# Patient Record
Sex: Female | Born: 1965 | Race: Black or African American | Hispanic: No | Smoking: Former smoker
Health system: Southern US, Community
[De-identification: ages and names within clinical notes are randomized; demographics above are authoritative.]

## PROBLEM LIST (undated history)

## (undated) ENCOUNTER — Emergency Department (HOSPITAL_COMMUNITY): Admission: EM | Payer: 59 | Source: Home / Self Care

## (undated) DIAGNOSIS — D509 Iron deficiency anemia, unspecified: Secondary | ICD-10-CM

## (undated) DIAGNOSIS — L509 Urticaria, unspecified: Secondary | ICD-10-CM

## (undated) HISTORY — PX: LYMPHADENECTOMY: SHX15

## (undated) HISTORY — DX: Urticaria, unspecified: L50.9

---

## 2009-10-02 ENCOUNTER — Emergency Department (HOSPITAL_COMMUNITY): Admission: EM | Admit: 2009-10-02 | Discharge: 2009-10-02 | Payer: Self-pay | Admitting: Emergency Medicine

## 2010-10-10 ENCOUNTER — Emergency Department: Payer: Self-pay

## 2010-10-19 ENCOUNTER — Ambulatory Visit: Payer: Self-pay | Admitting: Family Medicine

## 2011-09-07 ENCOUNTER — Ambulatory Visit: Payer: Self-pay | Admitting: Medical

## 2011-11-08 ENCOUNTER — Ambulatory Visit: Payer: Self-pay | Admitting: Family Medicine

## 2011-11-15 ENCOUNTER — Ambulatory Visit: Payer: Self-pay | Admitting: Family Medicine

## 2012-08-27 ENCOUNTER — Ambulatory Visit: Payer: Self-pay | Admitting: Family Medicine

## 2013-11-26 ENCOUNTER — Emergency Department: Payer: Self-pay | Admitting: Emergency Medicine

## 2014-04-27 ENCOUNTER — Ambulatory Visit: Payer: Self-pay | Admitting: Physician Assistant

## 2014-11-15 ENCOUNTER — Emergency Department (HOSPITAL_COMMUNITY): Payer: 59

## 2014-11-15 ENCOUNTER — Emergency Department (HOSPITAL_COMMUNITY)
Admission: EM | Admit: 2014-11-15 | Discharge: 2014-11-15 | Disposition: A | Discharge status: Home / Self Care | Payer: 59 | Attending: Emergency Medicine | Admitting: Emergency Medicine

## 2014-11-15 ENCOUNTER — Encounter (HOSPITAL_COMMUNITY): Payer: Self-pay | Admitting: Emergency Medicine

## 2014-11-15 DIAGNOSIS — S3992XA Unspecified injury of lower back, initial encounter: Secondary | ICD-10-CM | POA: Diagnosis present

## 2014-11-15 DIAGNOSIS — Y9289 Other specified places as the place of occurrence of the external cause: Secondary | ICD-10-CM | POA: Insufficient documentation

## 2014-11-15 DIAGNOSIS — W108XXA Fall (on) (from) other stairs and steps, initial encounter: Secondary | ICD-10-CM | POA: Insufficient documentation

## 2014-11-15 DIAGNOSIS — W19XXXA Unspecified fall, initial encounter: Secondary | ICD-10-CM

## 2014-11-15 DIAGNOSIS — Y998 Other external cause status: Secondary | ICD-10-CM | POA: Insufficient documentation

## 2014-11-15 DIAGNOSIS — Y9389 Activity, other specified: Secondary | ICD-10-CM | POA: Insufficient documentation

## 2014-11-15 DIAGNOSIS — S300XXA Contusion of lower back and pelvis, initial encounter: Secondary | ICD-10-CM

## 2014-11-15 DIAGNOSIS — Z862 Personal history of diseases of the blood and blood-forming organs and certain disorders involving the immune mechanism: Secondary | ICD-10-CM | POA: Diagnosis not present

## 2014-11-15 HISTORY — DX: Iron deficiency anemia, unspecified: D50.9

## 2014-11-15 MED ORDER — HYDROCODONE-ACETAMINOPHEN 5-325 MG PO TABS: 1.0000 | ORAL_TABLET | Freq: Four times a day (QID) | ORAL | Status: DC | PRN

## 2014-11-15 MED ORDER — NAPROXEN 500 MG PO TABS: 500.0000 mg | ORAL_TABLET | Freq: Two times a day (BID) | ORAL | Status: DC

## 2014-11-15 NOTE — Discharge Instructions (Signed)
Do not take the narcotic if driving as it will make you sleepy. °

## 2014-11-15 NOTE — ED Provider Notes (Signed)
CSN: [id number]     Arrival date & time 11/15/14  2054 History   First MD Initiated Contact with Patient 11/15/14 2213     Chief Complaint  Patient presents with  . Fall     (Consider location/radiation/quality/duration/timing/severity/associated sxs/prior Treatment) Patient is a 49 y.o. female presenting with fall. The history is provided by the patient.  Fall This is a new problem. The current episode started yesterday.   Linda Randolph is a 49 y.o. female who presents to the ED with pain and swelling to the buttock s/p fall down 4 steps yesterday. She states that he has applied ice and taken ibuprofen without relief. She did hit her head but denies LOC and states that is better.   Past Medical History  Diagnosis Date  . Iron deficiency anemia    History reviewed. No pertinent past surgical history. No family history on file. History  Substance Use Topics  . Smoking status: Never Smoker   . Smokeless tobacco: Not on file  . Alcohol Use: No   OB History    No data available     Review of Systems Negative except as stated in HPI   Allergies  Review of patient's allergies indicates no known allergies.  Home Medications   Prior to Admission medications   Medication Sig Start Date End Date Taking? Authorizing Provider  HYDROcodone-acetaminophen (NORCO) 5-325 MG per tablet Take 1 tablet by mouth every 6 (six) hours as needed. 11/15/14   Bradey Luzier Orlene Och, NP  naproxen (NAPROSYN) 500 MG tablet Take 1 tablet (500 mg total) by mouth 2 (two) times daily. 11/15/14   Dhamar Gregory Orlene Och, NP   BP 106/68 mmHg  Pulse 73  Temp(Src) 98.1 F (36.7 C) (Oral)  Resp 18  SpO2 100%  LMP 10/25/2014 (Approximate) Physical Exam  Constitutional: She is oriented to person, place, and time. She appears well-developed and well-nourished.  HENT:  Head: Normocephalic.  Eyes: EOM are normal.  Neck: Normal range of motion. Neck supple.  Cardiovascular: Normal rate.   Pulmonary/Chest: Effort  normal.  Musculoskeletal: Normal range of motion.       Back:  Large hematoma to the left buttock. Tender with palpation  Neurological: She is alert and oriented to person, place, and time. No cranial nerve deficit.  Skin: Skin is warm and dry.  Psychiatric: She has a normal mood and affect. Her behavior is normal.  Nursing note and vitals reviewed.   ED Course  Procedures (including critical care time) Labs Review Labs Reviewed - No data to display  Imaging Review Dg Pelvis 1-2 Views  11/15/2014   CLINICAL DATA:  Patient fell down steps landing on left buttocks region  EXAM: PELVIS - 1-2 VIEW  COMPARISON:  None.  FINDINGS: Standing frontal view obtained. There is no evidence of pelvic fracture or dislocation. Joint spaces appear intact. There is an intrauterine device in mid pelvis. There are phleboliths in the pelvis.  IMPRESSION: No fracture or dislocation. Joint spaces appear intact. Intrauterine device in mid pelvis.   Electronically Signed   By: Bretta Bang III M.D.   On: 11/15/2014 21:34     MDM  49 y.o. female with swelling and ecchymosis to the left buttock after falling down 4 steps yesterday. Stable for d/c without fracture noted on x-ray. Will treat for pain and she will continue to use ice and take ibuprofen. Discussed with the patient and all questioned fully answered. She will return if any problems arise.   Final diagnoses:  Traumatic hematoma of buttock, initial encounter  Fall, initial encounter        [facility], NP 11/16/14 1610  Pricilla Loveless, MD 11/18/14 1450

## 2014-11-15 NOTE — ED Notes (Signed)
Ice pack given

## 2014-11-15 NOTE — ED Notes (Signed)
Patient here with left buttock pain.  Patient fell down 4 steps yesterday.  She did hit her head but she did not loose consciousness.  Patient has been taking ibuprofen and ice pack but she is still in pain.  Patient does have swelling to buttock noted in triage.  No open wounds.  Patient is having some trouble walking with the pain.

## 2014-11-22 ENCOUNTER — Emergency Department (HOSPITAL_COMMUNITY)
Admission: EM | Admit: 2014-11-22 | Discharge: 2014-11-22 | Disposition: A | Discharge status: Home / Self Care | Payer: 59 | Attending: Emergency Medicine | Admitting: Emergency Medicine

## 2014-11-22 ENCOUNTER — Encounter (HOSPITAL_COMMUNITY): Payer: Self-pay | Admitting: Emergency Medicine

## 2014-11-22 DIAGNOSIS — S300XXA Contusion of lower back and pelvis, initial encounter: Secondary | ICD-10-CM

## 2014-11-22 DIAGNOSIS — W109XXA Fall (on) (from) unspecified stairs and steps, initial encounter: Secondary | ICD-10-CM | POA: Insufficient documentation

## 2014-11-22 DIAGNOSIS — Y999 Unspecified external cause status: Secondary | ICD-10-CM | POA: Diagnosis not present

## 2014-11-22 DIAGNOSIS — Z793 Long term (current) use of hormonal contraceptives: Secondary | ICD-10-CM | POA: Insufficient documentation

## 2014-11-22 DIAGNOSIS — A599 Trichomoniasis, unspecified: Secondary | ICD-10-CM

## 2014-11-22 DIAGNOSIS — Y929 Unspecified place or not applicable: Secondary | ICD-10-CM | POA: Insufficient documentation

## 2014-11-22 DIAGNOSIS — Z791 Long term (current) use of non-steroidal anti-inflammatories (NSAID): Secondary | ICD-10-CM | POA: Insufficient documentation

## 2014-11-22 DIAGNOSIS — Y939 Activity, unspecified: Secondary | ICD-10-CM | POA: Insufficient documentation

## 2014-11-22 DIAGNOSIS — S79912A Unspecified injury of left hip, initial encounter: Secondary | ICD-10-CM | POA: Diagnosis present

## 2014-11-22 DIAGNOSIS — D509 Iron deficiency anemia, unspecified: Secondary | ICD-10-CM | POA: Insufficient documentation

## 2014-11-22 DIAGNOSIS — Z79899 Other long term (current) drug therapy: Secondary | ICD-10-CM | POA: Insufficient documentation

## 2014-11-22 DIAGNOSIS — A5900 Urogenital trichomoniasis, unspecified: Secondary | ICD-10-CM | POA: Insufficient documentation

## 2014-11-22 LAB — CBC
HCT: 34.5 % — ABNORMAL LOW (ref 36.0–46.0)
Hemoglobin: 10.8 g/dL — ABNORMAL LOW (ref 12.0–15.0)
MCH: 26.2 pg (ref 26.0–34.0)
MCHC: 31.3 g/dL (ref 30.0–36.0)
MCV: 83.5 fL (ref 78.0–100.0)
PLATELETS: 273 10*3/uL (ref 150–400)
RBC: 4.13 MIL/uL (ref 3.87–5.11)
RDW: 16 % — AB (ref 11.5–15.5)
WBC: 7.1 10*3/uL (ref 4.0–10.5)

## 2014-11-22 LAB — WET PREP, GENITAL
CLUE CELLS WET PREP: NONE SEEN
Yeast Wet Prep HPF POC: NONE SEEN

## 2014-11-22 MED ORDER — STERILE WATER FOR INJECTION IJ SOLN
INTRAMUSCULAR | Status: AC
  Filled 2014-11-22: qty 10

## 2014-11-22 MED ORDER — METRONIDAZOLE 500 MG PO TABS: 500.0000 mg | ORAL_TABLET | Freq: Two times a day (BID) | ORAL | Status: DC

## 2014-11-22 MED ORDER — CEFTRIAXONE SODIUM 250 MG IJ SOLR
250.0000 mg | Freq: Once | INTRAMUSCULAR | Status: AC
  Administered 2014-11-22: 250 mg via INTRAMUSCULAR
  Filled 2014-11-22: qty 250

## 2014-11-22 MED ORDER — AZITHROMYCIN 250 MG PO TABS
1000.0000 mg | ORAL_TABLET | Freq: Once | ORAL | Status: AC
  Administered 2014-11-22: 1000 mg via ORAL
  Filled 2014-11-22: qty 4

## 2014-11-22 MED ORDER — STERILE WATER FOR INJECTION IJ SOLN
0.9000 mL | Freq: Once | INTRAMUSCULAR | Status: AC
  Administered 2014-11-22: 0.9 mL via INTRAMUSCULAR

## 2014-11-22 MED ORDER — IBUPROFEN 800 MG PO TABS
800.0000 mg | ORAL_TABLET | Freq: Once | ORAL | Status: AC
  Administered 2014-11-22: 800 mg via ORAL
  Filled 2014-11-22: qty 1

## 2014-11-22 MED ORDER — IBUPROFEN 800 MG PO TABS: 800.0000 mg | ORAL_TABLET | Freq: Three times a day (TID) | ORAL | Status: DC

## 2014-11-22 NOTE — ED Notes (Signed)
Pt. reports vaginal discharge onset 2 weeks ago and left hip pain from a fall last week . Denies fever or chills , Ambulatory/respirations unlabored .

## 2014-11-22 NOTE — Discharge Instructions (Signed)
Pelvic Infection ° °If you have been diagnosed with a pelvic infection such as a sexually transmitted disease, you will need to be treated with antibiotics. Please take the medicines as prescribed. Some of these tests do not come back for 1-2 days in which case if they turn positive you will receive a phone call to let you know. If you are contacted and do have an infection consistent with a sexually transmitted disease, then you will need to tell any and all sexual partners that you have had in the last 6 months no so that they can be tested and treated as well. If you should develop severe or worsening pain in your abdomen or the pelvis or develop severe fevers,nausea or vomiting that prevent you from taking your medications, return to the emergency department immediately. Otherwise contact your local physician or county health department for a follow up appointment to complete STD testing including HIV and syphilis.  See the list of phone numbers below. ° °RESOURCE GUIDE ° °Dental Problems ° °Patients with Medicaid: °[facility]                     [facility] °[address]                                           [address] °Phone:  [phone]                                                  Phone:  [phone] ° °If unable to pay or uninsured, contact:  [facility] or [facility] to become qualified for the adult dental clinic. ° °Chronic Pain Problems °Contact [facility]  [phone] °Patients need to be referred by their primary care doctor. ° °Insufficient Money for Medicine °Contact [employer]:  call "211" or [facility] 271-5999. ° °No Primary Care Doctor °Call [facility]  832-8000 °Other agencies that provide inexpensive medical care °   [facility]  [phone] °   [facility]  [phone] °   [facility]  [phone] °   [facility]  [phone] °   [facility]  [phone] ° [facility]  [phone] ° °Psychological Services °[facility]  [phone] °[facility]  [phone] °[facility]   [phone] (emergency services [phone]) ° °Substance Abuse Resources °[facility]  336-882-2125 °[facility] 336-784-9470 °[facility] 336-285-9073 °[facility] 336-845-3988 °Residential & Outpatient Substance Abuse Program  800-659-3381 ° °Abuse/Neglect °[place] [facility] (336) 641-3795 °[place] [facility] Hot[facility] 800-378-5315 (After Hours) ° °Emergency Shelter °[facility] (336) 271-5985 ° °Maternity Homes °[facility] (336) 275-9566 °[facility] (704) 372-4663 ° °MRSA Hotline #:   832-7006 ° ° ° °[place] [facility] ° °[facility] [facility]     [employer]                          [facility] °[address] [place]                         [address]      [address]  °                                                [place]                            [place] °Phone:  349-3220                                   Phone:  [phone]                 Phone:  342-8140 ° °[facility] °Phone:  342-8316 ° °[place] [facility] Hotline °(336) 342-1394 °(336) 342-3537 (After Hours) ° ° ° °

## 2014-11-22 NOTE — ED Provider Notes (Signed)
CSN: [id number]     Arrival date & time 11/22/14  1925 History   First MD Initiated Contact with Patient 11/22/14 2115     Chief Complaint  Patient presents with  . Vaginal Discharge  . Hip Pain     (Consider location/radiation/quality/duration/timing/severity/associated sxs/prior Treatment) HPI Comments: The pt is a 49 y/o female - had a fall in last 2 weeks where she struck her L buttock on a stair - has had swelling since that time - it is worse with sitting and palpation but better with standing - no numbness / weakness.  She has hx of easy bleeding and was told that she needed a transfusion in the past but did not take it.  She has associated vaginal d/c and has intercourse with a single partner - she is unsure if she has an STD -   Patient is a 49 y.o. female presenting with vaginal discharge and hip pain. The history is provided by the patient.  Vaginal Discharge Hip Pain    Past Medical History  Diagnosis Date  . Iron deficiency anemia    History reviewed. No pertinent past surgical history. No family history on file. History  Substance Use Topics  . Smoking status: Never Smoker   . Smokeless tobacco: Not on file  . Alcohol Use: No   OB History    No data available     Review of Systems  Genitourinary: Positive for vaginal discharge.  All other systems reviewed and are negative.     Allergies  Review of patient's allergies indicates no known allergies.  Home Medications   Prior to Admission medications   Medication Sig Start Date End Date Taking? Authorizing Provider  Carbonyl Iron 15 MG/1.25ML SUSP Take 4 drops by mouth daily.   Yes Historical Provider, MD  HYDROcodone-acetaminophen (NORCO) 5-325 MG per tablet Take 1 tablet by mouth every 6 (six) hours as needed. 11/15/14  Yes Hope Orlene Och, NP  levonorgestrel (MIRENA) 20 MCG/24HR IUD 1 each by Intrauterine route once. 09/30/12  Yes Historical Provider, MD  naproxen (NAPROSYN) 500 MG tablet Take 1 tablet (500  mg total) by mouth 2 (two) times daily. 11/15/14  Yes Hope Orlene Och, NP  ibuprofen (ADVIL,MOTRIN) 800 MG tablet Take 1 tablet (800 mg total) by mouth 3 (three) times daily. 11/22/14   Eber Hong, MD  metroNIDAZOLE (FLAGYL) 500 MG tablet Take 1 tablet (500 mg total) by mouth 2 (two) times daily. 11/22/14   Eber Hong, MD   BP 131/66 mmHg  Pulse 75  Temp(Src) 98.4 F (36.9 C) (Oral)  Resp 14  Ht 5' 9.5" (1.765 m)  Wt 196 lb (88.905 kg)  BMI 28.54 kg/m2  SpO2 97%  LMP 11/08/2014 Physical Exam  Constitutional: She appears well-developed and well-nourished. No distress.  HENT:  Head: Normocephalic and atraumatic.  Mouth/Throat: Oropharynx is clear and moist. No oropharyngeal exudate.  Eyes: Conjunctivae and EOM are normal. Pupils are equal, round, and reactive to light. Right eye exhibits no discharge. Left eye exhibits no discharge. No scleral icterus.  Neck: Normal range of motion. Neck supple. No JVD present. No thyromegaly present.  Cardiovascular: Normal rate, regular rhythm, normal heart sounds and intact distal pulses.  Exam reveals no gallop and no friction rub.   No murmur heard. Pulmonary/Chest: Effort normal and breath sounds normal. No respiratory distress. She has no wheezes. She has no rales.  Abdominal: Soft. Bowel sounds are normal. She exhibits no distension and no mass. There is no tenderness.  Genitourinary:  Chaperone present for exam - normal external gen, normal internal structures, IUD strings seen, no d/c, no foul odor, no FB, no bleedign, no CMT and no adnexal ttp or masses.  Musculoskeletal: Normal range of motion. She exhibits tenderness ( ttp in the L buttock with some firmness with hematoma and bruising.). She exhibits no edema.  LE's are normal without swellign, edema or asymetry  Lymphadenopathy:    She has no cervical adenopathy.  Neurological: She is alert. Coordination normal.  Skin: Skin is warm and dry. No rash noted. No erythema.  Psychiatric: She has  a normal mood and affect. Her behavior is normal.  Nursing note and vitals reviewed.   ED Course  Procedures (including critical care time) Labs Review Labs Reviewed  WET PREP, GENITAL - Abnormal; Notable for the following:    Trich, Wet Prep MODERATE (*)    WBC, Wet Prep HPF POC FEW (*)    All other components within normal limits  CBC - Abnormal; Notable for the following:    Hemoglobin 10.8 (*)    HCT 34.5 (*)    RDW 16.0 (*)    All other components within normal limits  GC/CHLAMYDIA PROBE AMP ([place]) NOT AT [facility]    Imaging Review No results found.    MDM   Final diagnoses:  Contusion of buttock, initial encounter  Trichomonas vaginalis infection    Well appaering, r/o anemia - ice, nsaids, GC / Chalmydia / We prep.  Pt has trich - informed of tx plan, she will tell partners.   xrays neg  CBC without clinically significant anemia  Stable for d/c.  Meds given in ED:  Medications  cefTRIAXone (ROCEPHIN) injection 250 mg (not administered)  azithromycin (ZITHROMAX) tablet 1,000 mg (not administered)  ibuprofen (ADVIL,MOTRIN) tablet 800 mg (800 mg Oral Given 11/22/14 2140)    New Prescriptions   IBUPROFEN (ADVIL,MOTRIN) 800 MG TABLET    Take 1 tablet (800 mg total) by mouth 3 (three) times daily.   METRONIDAZOLE (FLAGYL) 500 MG TABLET    Take 1 tablet (500 mg total) by mouth 2 (two) times daily.      Eber Hong, MD 11/22/14 2229

## 2014-11-23 LAB — GC/CHLAMYDIA PROBE AMP (~~LOC~~) NOT AT ARMC
CHLAMYDIA, DNA PROBE: NEGATIVE
Neisseria Gonorrhea: NEGATIVE

## 2015-04-22 ENCOUNTER — Ambulatory Visit
Admission: EM | Admit: 2015-04-22 | Discharge: 2015-04-22 | Disposition: A | Discharge status: Home / Self Care | Payer: 59 | Attending: Family Medicine | Admitting: Family Medicine

## 2015-04-22 ENCOUNTER — Encounter: Payer: Self-pay | Admitting: *Deleted

## 2015-04-22 DIAGNOSIS — M26622 Arthralgia of left temporomandibular joint: Secondary | ICD-10-CM | POA: Diagnosis not present

## 2015-04-22 MED ORDER — MELOXICAM 15 MG PO TABS: 15.0000 mg | ORAL_TABLET | Freq: Every day | ORAL | Status: DC | PRN

## 2015-04-22 MED ORDER — TRAMADOL HCL 50 MG PO TABS: 50.0000 mg | ORAL_TABLET | Freq: Three times a day (TID) | ORAL | Status: DC | PRN

## 2015-04-22 NOTE — ED Provider Notes (Signed)
[facility]  ____________________________________________  Time seen: Approximately 6:42 PM  I have reviewed the triage vital signs and the nursing notes.   HISTORY  Chief Complaint Otalgia   HPI Linda Randolph is a 49 y.o. female presents with a complaint of left ear pain. Patient reports that she has recently had some runny nose and nasal congestion but states that that has almost fully resolved. Patient states over the last 2 days she has had continued left ear pain. Patient reports that she does frequently grind her teeth at night. Patient does also report that she noticed that her last 3 days that she tends to hold her attention by clenching her teeth area and also reports that she recently has been eating more and she week foods.  Denies fall or direct trauma. Denies hearing deficits. Denies dysuria or drainage. States current left ear pain is 6 out of 10 aching and throbbing. Denies pain. Reports that she knows that she has multiple dental caries but denies other pain. Reports continues to eat and drink well. Denies and fevers. Denies neck pain or pain radiation.    Past Medical History  Diagnosis Date  . Iron deficiency anemia     There are no active problems to display for this patient.   History reviewed. No pertinent past surgical history.  LMP: 2 weeks ago. Denies chance of pregnancy.    Current Outpatient Rx  Name  Route  Sig  Dispense  Refill  .           . levonorgestrel (MIRENA) 20 MCG/24HR IUD   Intrauterine   1 each by Intrauterine route once.         .           .           .           .           .           .             Allergies Review of patient's allergies indicates no known allergies.  History reviewed. No pertinent family history.  Social History Social History  Substance Use Topics  . Smoking status: Never Smoker   . Smokeless tobacco: Never Used  . Alcohol Use: No    Review of Systems Constitutional: No  fever/chills Eyes: No visual changes. ENT: No sore throat. Left ear pain. Cardiovascular: Denies chest pain. Respiratory: Denies shortness of breath. Gastrointestinal: No abdominal pain.  No nausea, no vomiting.  No diarrhea.  No constipation. Genitourinary: Negative for dysuria. Musculoskeletal: Negative for back pain. Skin: Negative for rash. Neurological: Negative for headaches, focal weakness or numbness.  10-point ROS otherwise negative.  ____________________________________________   PHYSICAL EXAM:  VITAL SIGNS: ED Triage Vitals  Enc Vitals Group     BP 04/22/15 1713 139/75 mmHg     Pulse Rate 04/22/15 1713 76     Resp 04/22/15 1713 18     Temp 04/22/15 1713 98.4 F (36.9 C)     Temp Source 04/22/15 1713 Oral     SpO2 04/22/15 1713 99 %     Weight 04/22/15 1713 188 lb (85.276 kg)     Height 04/22/15 1713 5\' 10"  (1.778 m)     Head Cir --      Peak Flow --      Pain Score 04/22/15 1721 10     Pain Loc --  Pain Edu? --      Excl. in GC? --     Constitutional: Alert and oriented. Well appearing and in no acute distress. Eyes: Conjunctivae are normal. PERRL. EOMI. Head: Atraumatic. No sinus tenderness to palpation. No swelling. No erythema. Right TMJ nontender. Left TMJ moderate tenderness to palpation, full range of motion. No trismus.   Ears: no erythema, normal TMs bilaterally. No exudate or drainage bilaterally.  Nose: No congestion/rhinnorhea.  Mouth/Throat: Mucous membranes are moist.  Oropharynx non-erythematous. No tonsillar swelling or exudate. Multiple dental caries, no gum line erythema or swelling. No palpable or visualized dental abscess. Neck: No stridor.  No cervical spine tenderness to palpation. Hematological/Lymphatic/Immunilogical: No cervical lymphadenopathy. Cardiovascular: Normal rate, regular rhythm. Grossly normal heart sounds.  Good peripheral circulation. Respiratory: Normal respiratory effort.  No retractions. Lungs CTAB. No wheezes,  rales or rhonchi. Gastrointestinal: Soft and nontender.  Musculoskeletal: No lower or upper extremity tenderness nor edema.  Neurologic:  Normal speech and language. No gross focal neurologic deficits are appreciated. No gait instability. Skin:  Skin is warm, dry and intact. No rash noted. Psychiatric: Mood and affect are normal. Speech and behavior are normal.  ____________________________________________   LABS (all labs ordered are listed, but only abnormal results are displayed)  Labs Reviewed - No data to display  INITIAL IMPRESSION / ASSESSMENT AND PLAN / ED COURSE  Pertinent labs & imaging results that were available during my care of the patient were reviewed by me and considered in my medical decision making (see chart for details).  Very well-appearing patient. No acute distress. Presents for the complaints of 2 days of left otalgia. Patient also reports recent runny nose and nasal congestion which has resolved. Denies fall or trauma. Reports that she does grind her teeth and hold her tension by clinching her teeth. Patient with left point TMJ tenderness. Full ROM, Reports continues to eat and drink well. No signs of acute bacterial infection. Will treat with daily Mobic and when necessary tramadol for pain. Counseled also regarding following up with her dentist for fitting of a dental guard. Patient states that she has a dentist appointment in the next 2 weeks. Apply ice, avoid chewy foods as well as supportive treatments.  Discussed follow up with Primary care physician this week. Discussed follow up and return parameters including no resolution or any worsening concerns. Patient verbalized understanding and agreed to plan.   ____________________________________________   FINAL CLINICAL IMPRESSION(S) / ED DIAGNOSES  Final diagnoses:  pain of left temporomandibular joint       Renford Dills, NP 04/22/15 1850

## 2015-04-22 NOTE — Discharge Instructions (Signed)
Take medication as prescribed. Rest. Apply ice.Avoid excessively chewy foods.   Follow up with your dentist as discussed. Follow-up with primary care physician as needed. Return to urgent care as needed for new or worsening concerns.

## 2015-04-22 NOTE — ED Notes (Signed)
Patient reports that left ear ache symptoms started this past Monday and have worsened. Patient also reports left side sinus pain.

## 2015-05-05 ENCOUNTER — Encounter (HOSPITAL_COMMUNITY): Payer: Self-pay | Admitting: Emergency Medicine

## 2015-05-05 DIAGNOSIS — R51 Headache: Secondary | ICD-10-CM | POA: Insufficient documentation

## 2015-05-05 DIAGNOSIS — D509 Iron deficiency anemia, unspecified: Secondary | ICD-10-CM | POA: Insufficient documentation

## 2015-05-05 DIAGNOSIS — Z792 Long term (current) use of antibiotics: Secondary | ICD-10-CM | POA: Insufficient documentation

## 2015-05-05 DIAGNOSIS — Z79899 Other long term (current) drug therapy: Secondary | ICD-10-CM | POA: Diagnosis not present

## 2015-05-05 DIAGNOSIS — Z791 Long term (current) use of non-steroidal anti-inflammatories (NSAID): Secondary | ICD-10-CM | POA: Insufficient documentation

## 2015-05-05 NOTE — ED Notes (Signed)
Pt states for the last month she has been having a left sided headache into the left side of her face into left ear and under chin. Pt states her PCP told her it could be from stress or clinching her jaw however pain is no better pt has been taking motrin and tyenlol with no relief.  Pt has no neuro deficits. No dizziness or blurry vision.

## 2015-05-06 ENCOUNTER — Emergency Department (HOSPITAL_COMMUNITY)
Admission: EM | Admit: 2015-05-06 | Discharge: 2015-05-06 | Disposition: A | Discharge status: Home / Self Care | Payer: 59 | Attending: Emergency Medicine | Admitting: Emergency Medicine

## 2015-05-06 ENCOUNTER — Emergency Department (HOSPITAL_COMMUNITY): Payer: 59

## 2015-05-06 ENCOUNTER — Telehealth: Payer: Self-pay | Admitting: Emergency Medicine

## 2015-05-06 DIAGNOSIS — R51 Headache: Secondary | ICD-10-CM

## 2015-05-06 DIAGNOSIS — R519 Headache, unspecified: Secondary | ICD-10-CM

## 2015-05-06 MED ORDER — SODIUM CHLORIDE 0.9 % IV BOLUS (SEPSIS)
1000.0000 mL | Freq: Once | INTRAVENOUS | Status: AC
  Administered 2015-05-06: 1000 mL via INTRAVENOUS

## 2015-05-06 MED ORDER — KETOROLAC TROMETHAMINE 30 MG/ML IJ SOLN
30.0000 mg | Freq: Once | INTRAMUSCULAR | Status: AC
  Administered 2015-05-06: 30 mg via INTRAVENOUS
  Filled 2015-05-06: qty 1

## 2015-05-06 MED ORDER — METOCLOPRAMIDE HCL 5 MG/ML IJ SOLN
10.0000 mg | Freq: Once | INTRAMUSCULAR | Status: AC
  Administered 2015-05-06: 10 mg via INTRAVENOUS
  Filled 2015-05-06: qty 2

## 2015-05-06 MED ORDER — BUTALBITAL-APAP-CAFFEINE 50-325-40 MG PO TABS: 1.0000 | ORAL_TABLET | Freq: Four times a day (QID) | ORAL | Status: AC | PRN

## 2015-05-06 MED ORDER — DIPHENHYDRAMINE HCL 50 MG/ML IJ SOLN
25.0000 mg | Freq: Once | INTRAMUSCULAR | Status: AC
  Administered 2015-05-06: 25 mg via INTRAVENOUS
  Filled 2015-05-06: qty 1

## 2015-05-06 NOTE — ED Notes (Signed)
No answer for room placement.

## 2015-05-06 NOTE — ED Notes (Signed)
Transported patient to CT.

## 2015-05-06 NOTE — Discharge Instructions (Signed)
Recurrent Migraine Headache A migraine headache is an intense, throbbing pain on one or both sides of your head. Recurrent migraines keep coming back. A migraine can last for 30 minutes to several hours. CAUSES  The exact cause of a migraine headache is not always known. However, a migraine may be caused when nerves in the brain become irritated and release chemicals that cause inflammation. This causes pain. Certain things may also trigger migraines, such as:   Alcohol.  Smoking.  Stress.  Menstruation.  Aged cheeses.  Foods or drinks that contain nitrates, glutamate, aspartame, or tyramine.  Lack of sleep.  Chocolate.  Caffeine.  Hunger.  Physical exertion.  Fatigue.  Medicines used to treat chest pain (nitroglycerine), birth control pills, estrogen, and some blood pressure medicines. SYMPTOMS   Pain on one or both sides of your head.  Pulsating or throbbing pain.  Severe pain that prevents daily activities.  Pain that is aggravated by any physical activity.  Nausea, vomiting, or both.  Dizziness.  Pain with exposure to bright lights, loud noises, or activity.  General sensitivity to bright lights, loud noises, or smells. Before you get a migraine, you may get warning signs that a migraine is coming (aura). An aura may include:  Seeing flashing lights.  Seeing bright spots, halos, or zigzag lines.  Having tunnel vision or blurred vision.  Having feelings of numbness or tingling.  Having trouble talking.  Having muscle weakness. DIAGNOSIS  A recurrent migraine headache is often diagnosed based on:  Symptoms.  Physical examination.  A CT scan or MRI of your head. These imaging tests cannot diagnose migraines but can help rule out other causes of headaches.  TREATMENT  Medicines may be given for pain and nausea. Medicines can also be given to help prevent recurrent migraines. HOME CARE INSTRUCTIONS  Only take over-the-counter or prescription  medicines for pain or discomfort as directed by your health care provider. The use of long-term narcotics is not recommended.  Lie down in a dark, quiet room when you have a migraine.  Keep a journal to find out what may trigger your migraine headaches. For example, write down:  What you eat and drink.  How much sleep you get.  Any change to your diet or medicines.  Limit alcohol consumption.  Quit smoking if you smoke.  Get 7-9 hours of sleep, or as recommended by your health care provider.  Limit stress.  Keep lights dim if bright lights bother you and make your migraines worse. SEEK MEDICAL CARE IF:   You do not get relief from the medicines given to you.  You have a recurrence of pain.  You have a fever. SEEK IMMEDIATE MEDICAL CARE IF:  Your migraine becomes severe.  You have a stiff neck.  You have loss of vision.  You have muscular weakness or loss of muscle control.  You start losing your balance or have trouble walking.  You feel faint or pass out.  You have severe symptoms that are different from your first symptoms. MAKE SURE YOU:   Understand these instructions.  Will watch your condition.  Will get help right away if you are not doing well or get worse.   This information is not intended to replace advice given to you by your health care provider. Make sure you discuss any questions you have with your health care provider.   Document Released: 01/03/2001 Document Revised: 05/01/2014 Document Reviewed: 12/16/2012 Elsevier Interactive Patient Education 2016 [employer]  

## 2015-05-06 NOTE — ED Notes (Signed)
Patient up ambulatory to the bathroom at this time without any difficulty or distress 

## 2015-05-06 NOTE — ED Provider Notes (Signed)
CSN: [id number]     Arrival date & time 05/05/15  2058 History   First MD Initiated Contact with Patient 05/06/15 0020     Chief Complaint  Patient presents with  . Headache  . Facial Pain     (Consider location/radiation/quality/duration/timing/severity/associated sxs/prior Treatment) HPI   50 year old female without any significant past medical history aside from iron deficiency anemia who presents for evaluation of headache. Patient reports progressive left-sided headache ongoing for the past 1 month. She described it as throbbing sensation, persistent, seems to be worsening with stress. She has been taking over-the-counter pain medication including Tylenol and ibuprofen with some relief but the headache never fully resolved. She was seen at urgent care for the same complaint and was prescribed meloxicam and tramadol in the past which did help. Urgent Care felt that the headache was attributed to stress. She currently rates her headache as 10 out of 10. Showering in warm water seems to help with her headache. There is no associated fever, URI symptoms, nasal congestion, hearing changes, vision changes, neck stiffness, speech problem, focal numbness or weakness, or rash. She denies any specific injury. She does not have any active cancer. She denies any fever, night sweats, or abnormal weight changes. No prior history of migraine headache. Denies any light or sound sensitivity or any scintillating scotoma. Patient is a nonsmoker. No associated lightheadedness of dizziness.  Past Medical History  Diagnosis Date  . Iron deficiency anemia    History reviewed. No pertinent past surgical history. No family history on file. Social History  Substance Use Topics  . Smoking status: Never Smoker   . Smokeless tobacco: Never Used  . Alcohol Use: No   OB History    No data available     Review of Systems  All other systems reviewed and are negative.     Allergies  Review of patient's  allergies indicates no known allergies.  Home Medications   Prior to Admission medications   Medication Sig Start Date End Date Taking? Authorizing Provider  Carbonyl Iron 15 MG/1.25ML SUSP Take 4 drops by mouth daily.    Historical Provider, MD  HYDROcodone-acetaminophen (NORCO) 5-325 MG per tablet Take 1 tablet by mouth every 6 (six) hours as needed. 11/15/14   Hope Orlene Och, NP  ibuprofen (ADVIL,MOTRIN) 800 MG tablet Take 1 tablet (800 mg total) by mouth 3 (three) times daily. 11/22/14   Eber Hong, MD  levonorgestrel (MIRENA) 20 MCG/24HR IUD 1 each by Intrauterine route once. 09/30/12   Historical Provider, MD  meloxicam (MOBIC) 15 MG tablet Take 1 tablet (15 mg total) by mouth daily as needed for pain. 04/22/15   Renford Dills, NP  metroNIDAZOLE (FLAGYL) 500 MG tablet Take 1 tablet (500 mg total) by mouth 2 (two) times daily. 11/22/14   Eber Hong, MD  naproxen (NAPROSYN) 500 MG tablet Take 1 tablet (500 mg total) by mouth 2 (two) times daily. 11/15/14   Hope Orlene Och, NP  traMADol (ULTRAM) 50 MG tablet Take 1 tablet (50 mg total) by mouth every 8 (eight) hours as needed (Do not drive or operate machinery while taking as can cause drowsiness.). 04/22/15   Renford Dills, NP   BP 127/62 mmHg  Pulse 65  Temp(Src) 98.1 F (36.7 C) (Oral)  Resp 16  Ht 5\' 10"  (1.778 m)  Wt 188 lb (85.276 kg)  BMI 26.98 kg/m2  SpO2 100%  LMP 04/08/2015 Physical Exam  Constitutional: She is oriented to person, place, and time. She appears well-developed  and well-nourished. No distress.  Well appearing African-American female in no acute discomfort, nontoxic  HENT:  Head: Atraumatic.  Right Ear: External ear normal.  Left Ear: External ear normal.  Mouth/Throat: Oropharynx is clear and moist. No oropharyngeal exudate.  Eyes: Conjunctivae and EOM are normal. Pupils are equal, round, and reactive to light.  Neck: Normal range of motion. Neck supple.  No nuchal rigidity  Cardiovascular: Normal rate and  regular rhythm.   Pulmonary/Chest: Effort normal and breath sounds normal.  Abdominal: Soft.  Neurological: She is alert and oriented to person, place, and time.  Neurologic exam:  Speech clear, pupils equal round reactive to light, extraocular movements intact  Normal peripheral visual fields Cranial nerves III through XII normal including no facial droop Follows commands, moves all extremities x4, normal strength to bilateral upper and lower extremities at all major muscle groups including grip Sensation normal to light touch and pinprick Coordination intact, no limb ataxia, finger-nose-finger normal Rapid alternating movements normal No pronator drift Gait normal   Skin: No rash noted.  Psychiatric: She has a normal mood and affect.  Nursing note and vitals reviewed.   ED Course  Procedures (including critical care time)   MDM   Final diagnoses:  Recurrent headache    BP 115/61 mmHg  Pulse 66  Temp(Src) 98.1 F (36.7 C) (Oral)  Resp 18  Ht 5\' 10"  (1.778 m)  Wt 85.276 kg  BMI 26.98 kg/m2  SpO2 99%  LMP 04/08/2015   6:43 AM Patient presents with a persistent left-sided headache ongoing for the past month. She does not exhibit any red flags. She is well appearing. No fever or nuchal rigidity concerning for meningitis. No acute onset thunderclap headache concerning for subarachnoid hemorrhage. No focal neuro deficits concerning for stroke. Given the prolonged duration of headache, I will obtain a head CT scan further evaluation. Migraine cocktail offer for symptomatic relief. Patient voiced understanding and agrees with plan.  10:49 AM Head CT unremarkable. Headache markedly improved.  Will d/c with neuro f/u and fioricet for pain as needed.    Fayrene HelperBowie Cohl Behrens, PA-C 05/06/15 1049  Shon Batonourtney F Horton, MD 05/06/15 2256

## 2015-05-08 ENCOUNTER — Ambulatory Visit
Admission: EM | Admit: 2015-05-08 | Discharge: 2015-05-08 | Disposition: A | Discharge status: Home / Self Care | Payer: 59 | Attending: Family Medicine | Admitting: Family Medicine

## 2015-05-08 ENCOUNTER — Encounter: Payer: Self-pay | Admitting: Gynecology

## 2015-05-08 DIAGNOSIS — M26622 Arthralgia of left temporomandibular joint: Secondary | ICD-10-CM | POA: Diagnosis not present

## 2015-05-08 MED ORDER — TRAMADOL HCL 50 MG PO TABS: ORAL_TABLET | ORAL | Status: DC

## 2015-05-08 MED ORDER — MELOXICAM 15 MG PO TABS: 15.0000 mg | ORAL_TABLET | Freq: Every day | ORAL | Status: DC

## 2015-05-08 NOTE — ED Provider Notes (Signed)
CSN: [id number]     Arrival date & time 05/08/15  1001 History   First MD Initiated Contact with Patient 05/08/15 1209     Chief Complaint  Patient presents with  . 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 Denies any fevers, chills.   Patient is a 50 y.o. female presenting with headaches. The history is provided by the patient.  Headache   Past Medical History  Diagnosis Date  . Iron deficiency anemia    Past Surgical History  Procedure Laterality Date  . Lymphadenectomy     No family history on file. Social History  Substance Use Topics  . Smoking status: Never Smoker   . Smokeless tobacco: Never Used  . Alcohol Use: No   OB History    No data available     Review of Systems  Neurological: Positive for headaches.    Allergies  Review of patient's allergies indicates no known allergies.  Home Medications   Prior to Admission medications   Medication Sig Start Date End Date Taking? Authorizing Provider  butalbital-acetaminophen-caffeine (FIORICET) [phone] MG tablet Take 1-2 tablets by mouth every 6 (six) hours as needed for headache. 05/06/15 05/05/16 Yes Fayrene HelperBowie Tran, PA-C  levonorgestrel (MIRENA) 20 MCG/24HR IUD 1 each by Intrauterine route once. 09/30/12   Historical Provider, MD  meloxicam (MOBIC) 15 MG tablet Take 1 tablet (15 mg total) by mouth daily. 05/08/15   Payton Mccallumrlando Lenette Rau, MD  traMADol (ULTRAM) 50 MG tablet 1 tab po q 8 hours prn 05/08/15   Payton Mccallumrlando Tc Kapusta, MD   Meds Ordered and Administered this Visit  Medications - No data to display  BP 129/68 mmHg  Pulse 64  Temp(Src) 98 F (36.7 C) (Oral)  Resp 16  Ht 5\' 10"  (1.778 m)  Wt 191 lb (86.637  kg)  BMI 27.41 kg/m2  SpO2 100%  LMP 04/08/2015 No data found.   Physical Exam  Constitutional: She appears well-developed and well-nourished. No distress.  HENT:  Head: Normocephalic and atraumatic.  Right Ear: Tympanic membrane, external ear and ear canal normal.  Left Ear: Tympanic membrane, external ear and ear canal normal.  Nose: No mucosal edema, rhinorrhea, nose lacerations, sinus tenderness, nasal deformity, septal deviation or nasal septal hematoma. No epistaxis.  No foreign bodies.  Mouth/Throat: Uvula is midline, oropharynx is clear and moist and mucous membranes are normal. No oropharyngeal exudate, posterior oropharyngeal edema, posterior oropharyngeal erythema or tonsillar abscesses.  Tenderness to palpation over the left TMJ  Eyes: Conjunctivae and EOM are normal. Pupils are equal, round, and reactive to light. Right eye exhibits no discharge. Left eye exhibits no discharge. No scleral icterus.  Neck: Normal range of motion. Neck supple. No thyromegaly present.  Cardiovascular: Normal rate, regular rhythm and normal heart sounds.   Pulmonary/Chest: Effort normal and breath sounds normal. No respiratory distress. She has no wheezes. She has no rales.  Lymphadenopathy:    She has no cervical adenopathy.  Skin: No rash noted. She is not diaphoretic.  Nursing note and vitals reviewed.   ED Course  Procedures (including critical care time)  Labs Review Labs Reviewed - No data to display  Imaging Review No results found.   Visual Acuity Review  Right Eye Distance:   Left Eye Distance:  Bilateral Distance:    Right Eye Near:   Left Eye Near:    Bilateral Near:         MDM   1. Arthralgia of left temporomandibular joint    Discharge Medication List as of 05/08/2015 12:29 PM    START taking these medications   Details  traMADol (ULTRAM) 50 MG tablet 1 tab po q 8 hours prn, Print       1. diagnosis reviewed with patient 2. rx as per orders above;  reviewed possible side effects, interactions, risks and benefits  3. Recommend supportive treatment with ice to affected area 4. Follow-up with PCP and dentist     Payton Mccallum, MD 05/08/15 1536

## 2015-05-08 NOTE — ED Notes (Addendum)
Patient c/o  Severed left side head pain /ear/ jaw and eyes x 1 month. Per pt. [facility]05/06/15) days and was given Rx Fioricet ,but the medication not working.Patient stated also seen at an urgent care the same day(05/06/15) and was given RX Meloxicam 15mg  #30 which is not working either. Patient stated was seen on 04/22/15 at our clinic for the same symptoms and according to our records was given Rx Meloxicam15 mg and Tramadol 50mg . Patient stated lost medication that was given to her in December by our clinic.

## 2015-06-11 ENCOUNTER — Ambulatory Visit
Admission: EM | Admit: 2015-06-11 | Discharge: 2015-06-11 | Disposition: A | Discharge status: Home / Self Care | Payer: 59 | Attending: Family Medicine | Admitting: Family Medicine

## 2015-06-11 ENCOUNTER — Encounter: Payer: Self-pay | Admitting: *Deleted

## 2015-06-11 DIAGNOSIS — J0100 Acute maxillary sinusitis, unspecified: Secondary | ICD-10-CM | POA: Diagnosis not present

## 2015-06-11 DIAGNOSIS — J069 Acute upper respiratory infection, unspecified: Secondary | ICD-10-CM | POA: Diagnosis not present

## 2015-06-11 LAB — RAPID INFLUENZA A&B ANTIGENS (ARMC ONLY)
INFLUENZA A (ARMC): NOT DETECTED
INFLUENZA B (ARMC): NOT DETECTED

## 2015-06-11 LAB — RAPID STREP SCREEN (MED CTR MEBANE ONLY): Streptococcus, Group A Screen (Direct): NEGATIVE

## 2015-06-11 MED ORDER — HYDROCOD POLST-CPM POLST ER 10-8 MG/5ML PO SUER: 5.0000 mL | Freq: Two times a day (BID) | ORAL | Status: DC

## 2015-06-11 MED ORDER — AZITHROMYCIN 250 MG PO TABS: ORAL_TABLET | ORAL | Status: DC

## 2015-06-11 MED ORDER — BENZONATATE 200 MG PO CAPS: ORAL_CAPSULE | ORAL | Status: DC

## 2015-06-11 MED ORDER — FLUTICASONE PROPIONATE 50 MCG/ACT NA SUSP: 2.0000 | Freq: Every day | NASAL | Status: DC

## 2015-06-11 NOTE — Discharge Instructions (Signed)
Cool Mist Vaporizers Vaporizers may help relieve the symptoms of a cough and cold. They add moisture to the air, which helps mucus to become thinner and less sticky. This makes it easier to breathe and cough up secretions. Cool mist vaporizers do not cause serious burns like hot mist vaporizers, which may also be called steamers or humidifiers. Vaporizers have not been proven to help with colds. You should not use a vaporizer if you are allergic to mold. HOME CARE INSTRUCTIONS  Follow the package instructions for the vaporizer.  Do not use anything other than distilled water in the vaporizer.  Do not run the vaporizer all of the time. This can cause mold or bacteria to grow in the vaporizer.  Clean the vaporizer after each time it is used.  Clean and dry the vaporizer well before storing it.  Stop using the vaporizer if worsening respiratory symptoms develop.   This information is not intended to replace advice given to you by your health care provider. Make sure you discuss any questions you have with your health care provider.   Document Released: 01/06/2004 Document Revised: 04/15/2013 Document Reviewed: 08/28/2012 Elsevier Interactive Patient Education 2016 Else[employer] Inc[employer]  Sinusitis, Adult Sinusitis is redness, soreness, and inflammation of the paranasal sinuses. Paranasal sinuses are air pockets within the bones of your face. They are located beneath your eyes, in the middle of your forehead, and above your eyes. In healthy paranasal sinuses, mucus is able to drain out, and air is able to circulate through them by way of your nose. However, when your paranasal sinuses are inflamed, mucus and air can become trapped. This can allow bacteria and other germs to grow and cause infection. Sinusitis can develop quickly and last only a short time (acute) or continue over a long period (chronic). Sinusitis that lasts for more than 12 weeks is considered chronic. CAUSES Causes of sinusitis  include:  Allergies.  Structural abnormalities, such as displacement of the cartilage that separates your nostrils (deviated septum), which can decrease the air flow through your nose and sinuses and affect sinus drainage.  Functional abnormalities, such as when the small hairs (cilia) that line your sinuses and help remove mucus do not work properly or are not present. SIGNS AND SYMPTOMS Symptoms of acute and chronic sinusitis are the same. The primary symptoms are pain and pressure around the affected sinuses. Other symptoms include:  Upper toothache.  Earache.  Headache.  Bad breath.  Decreased sense of smell and taste.  A cough, which worsens when you are lying flat.  Fatigue.  Fever.  Thick drainage from your nose, which often is green and may contain pus (purulent).  Swelling and warmth over the affected sinuses. DIAGNOSIS Your health care provider will perform a physical exam. During your exam, your health care provider may perform any of the following to help determine if you have acute sinusitis or chronic sinusitis:  Look in your nose for signs of abnormal growths in your nostrils (nasal polyps).  Tap over the affected sinus to check for signs of infection.  View the inside of your sinuses using an imaging device that has a light attached (endoscope). If your health care provider suspects that you have chronic sinusitis, one or more of the following tests may be recommended:  Allergy tests.  Nasal culture. A sample of mucus is taken from your nose, sent to a lab, and screened for bacteria.  Nasal cytology. A sample of mucus is taken from your nose and examined  by your health care provider to determine if your sinusitis is related to an allergy. TREATMENT Most cases of acute sinusitis are related to a viral infection and will resolve on their own within 10 days. Sometimes, medicines are prescribed to help relieve symptoms of both acute and chronic sinusitis.  These may include pain medicines, decongestants, nasal steroid sprays, or saline sprays. However, for sinusitis related to a bacterial infection, your health care provider will prescribe antibiotic medicines. These are medicines that will help kill the bacteria causing the infection. Rarely, sinusitis is caused by a fungal infection. In these cases, your health care provider will prescribe antifungal medicine. For some cases of chronic sinusitis, surgery is needed. Generally, these are cases in which sinusitis recurs more than 3 times per year, despite other treatments. HOME CARE INSTRUCTIONS  Drink plenty of water. Water helps thin the mucus so your sinuses can drain more easily.  Use a humidifier.  Inhale steam 3-4 times a day (for example, sit in the bathroom with the shower running).  Apply a warm, moist washcloth to your face 3-4 times a day, or as directed by your health care provider.  Use saline nasal sprays to help moisten and clean your sinuses.  Take medicines only as directed by your health care provider.  If you were prescribed either an antibiotic or antifungal medicine, finish it all even if you start to feel better. SEEK IMMEDIATE MEDICAL CARE IF:  You have increasing pain or severe headaches.  You have nausea, vomiting, or drowsiness.  You have swelling around your face.  You have vision problems.  You have a stiff neck.  You have difficulty breathing.   This information is not intended to replace advice given to you by your health care provider. Make sure you discuss any questions you have with your health care provider.   Document Released: 04/10/2005 Document Revised: 05/01/2014 Document Reviewed: 04/25/2011 Elsevier Interactive Patient Education 2016 Else[employer] Inc.  Upper Respiratory Infection, Adult Most upper respiratory infections (URIs) are a viral infection of the air passages leading to the lungs. A URI affects the nose, throat, and upper air  passages. The most common type of URI is nasopharyngitis and is typically referred to as "the common cold." URIs run their course and usually go away on their own. Most of the time, a URI does not require medical attention, but sometimes a bacterial infection in the upper airways can follow a viral infection. This is called a secondary infection. Sinus and middle ear infections are common types of secondary upper respiratory infections. Bacterial pneumonia can also complicate a URI. A URI can worsen asthma and chronic obstructive pulmonary disease (COPD). Sometimes, these complications can require emergency medical care and may be life threatening.  CAUSES Almost all URIs are caused by viruses. A virus is a type of germ and can spread from one person to another.  RISKS FACTORS You may be at risk for a URI if:   You smoke.   You have chronic heart or lung disease.  You have a weakened defense (immune) system.   You are very young or very old.   You have nasal allergies or asthma.  You work in crowded or poorly ventilated areas.  You work in health care facilities or schools. SIGNS AND SYMPTOMS  Symptoms typically develop 2-3 days after you come in contact with a cold virus. Most viral URIs last 7-10 days. However, viral URIs from the influenza virus (flu virus) can last 14-18 days and  are typically more severe. Symptoms may include:   Runny or stuffy (congested) nose.   Sneezing.   Cough.   Sore throat.   Headache.   Fatigue.   Fever.   Loss of appetite.   Pain in your forehead, behind your eyes, and over your cheekbones (sinus pain).  Muscle aches.  DIAGNOSIS  Your health care provider may diagnose a URI by:  Physical exam.  Tests to check that your symptoms are not due to another condition such as:  Strep throat.  Sinusitis.  Pneumonia.  Asthma. TREATMENT  A URI goes away on its own with time. It cannot be cured with medicines, but medicines may  be prescribed or recommended to relieve symptoms. Medicines may help:  Reduce your fever.  Reduce your cough.  Relieve nasal congestion. HOME CARE INSTRUCTIONS   Take medicines only as directed by your health care provider.   Gargle warm saltwater or take cough drops to comfort your throat as directed by your health care provider.  Use a warm mist humidifier or inhale steam from a shower to increase air moisture. This may make it easier to breathe.  Drink enough fluid to keep your urine clear or pale yellow.   Eat soups and other clear broths and maintain good nutrition.   Rest as needed.   Return to work when your temperature has returned to normal or as your health care provider advises. You may need to stay home longer to avoid infecting others. You can also use a face mask and careful hand washing to prevent spread of the virus.  Increase the usage of your inhaler if you have asthma.   Do not use any tobacco products, including cigarettes, chewing tobacco, or electronic cigarettes. If you need help quitting, ask your health care provider. PREVENTION  The best way to protect yourself from getting a cold is to practice good hygiene.   Avoid oral or hand contact with people with cold symptoms.   Wash your hands often if contact occurs.  There is no clear evidence that vitamin C, vitamin E, echinacea, or exercise reduces the chance of developing a cold. However, it is always recommended to get plenty of rest, exercise, and practice good nutrition.  SEEK MEDICAL CARE IF:   You are getting worse rather than better.   Your symptoms are not controlled by medicine.   You have chills.  You have worsening shortness of breath.  You have brown or red mucus.  You have yellow or brown nasal discharge.  You have pain in your face, especially when you bend forward.  You have a fever.  You have swollen neck glands.  You have pain while swallowing.  You have white  areas in the back of your throat. SEEK IMMEDIATE MEDICAL CARE IF:   You have severe or persistent:  Headache.  Ear pain.  Sinus pain.  Chest pain.  You have chronic lung disease and any of the following:  Wheezing.  Prolonged cough.  Coughing up blood.  A change in your usual mucus.  You have a stiff neck.  You have changes in your:  Vision.  Hearing.  Thinking.  Mood. MAKE SURE YOU:   Understand these instructions.  Will watch your condition.  Will get help right away if you are not doing well or get worse.   This information is not intended to replace advice given to you by your health care provider. Make sure you discuss any questions you have with your health  care provider.   Document Released: 10/04/2000 Document Revised: 08/25/2014 Document Reviewed: 07/16/2013 Elsevier Interactive Patient Education [employer]

## 2015-06-11 NOTE — ED Notes (Signed)
Productive cough- clear, sore throat, left ear pain, ? Fever x3 weeks.

## 2015-06-11 NOTE — ED Provider Notes (Signed)
CSN: [id number]     Arrival date & time 06/11/15  1517 History   First MD Initiated Contact with Patient 06/11/15 1722     Chief Complaint  Patient presents with  . Cough  . Otalgia  . 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 Left ear is also bothersome. He has had chills but has not taken her temperature at all. She has tried Zyrtec and Benadryl without any help. Sputum production is  clear. Today she is afebrile has a pulse of 78 respirations 16 blood pressure 135/71 and O2 sat is 99% on room air.    Past Medical History  Diagnosis Date  . Iron deficiency anemia    Past Surgical History  Procedure Laterality Date  . Lymphadenectomy     History reviewed. No pertinent family history. Social History  Substance Use Topics  . Smoking status: Never Smoker   . Smokeless tobacco: Never Used  . Alcohol Use: No   OB History    No data available     Review of Systems  Constitutional: Positive for chills. Negative for fever, activity change, appetite change and fatigue.  HENT: Positive for congestion, ear pain, postnasal drip, rhinorrhea, sinus pressure and sore throat.   Respiratory: Positive for cough and wheezing. Negative for shortness of breath and stridor.   All other systems reviewed and are negative.   Allergies  Review of patient's allergies indicates no known allergies.  Home Medications   Prior to Admission medications   Medication Sig Start Date End Date Taking? Authorizing Provider  butalbital-acetaminophen-caffeine (FIORICET) [phone] MG tablet Take 1-2 tablets by mouth every 6 (six) hours as needed for headache. 05/06/15 05/05/16 Yes Fayrene Helper, PA-C  levonorgestrel (MIRENA) 20 MCG/24HR IUD 1 each by Intrauterine route once. 09/30/12  Yes Historical Provider, MD   traMADol (ULTRAM) 50 MG tablet 1 tab po q 8 hours prn 05/08/15  Yes Payton Mccallum, MD  azithromycin (ZITHROMAX Z-PAK) 250 MG tablet Use as per package instructions 06/11/15   Lutricia Feil, PA-C  benzonatate (TESSALON) 200 MG capsule Take one capsule TID for cough 06/11/15   Lutricia Feil, PA-C  chlorpheniramine-HYDROcodone (TUSSIONEX PENNKINETIC ER) 10-8 MG/5ML SUER Take 5 mLs by mouth 2 (two) times daily. 06/11/15   Lutricia Feil, PA-C  fluticasone (FLONASE) 50 MCG/ACT nasal spray Place 2 sprays into both nostrils daily. 06/11/15   Lutricia Feil, PA-C  meloxicam (MOBIC) 15 MG tablet Take 1 tablet (15 mg total) by mouth daily. 05/08/15   Payton Mccallum, MD   Meds Ordered and Administered this Visit  Medications - No data to display  BP 135/71 mmHg  Pulse 78  Temp(Src) 97.6 F (36.4 C) (Oral)  Resp 16  Ht  (1.778 m)  Wt 185 lb (83.915 kg)  BMI 26.54 kg/m2  SpO2 99% No data found.   Physical Exam  Constitutional: She is oriented to person, place, and time. She appears well-developed and well-nourished. No distress.  HENT:  Head: Normocephalic and atraumatic.  Right Ear: External ear normal.  Left Ear: External ear normal.  Nose: Nose normal.  Mouth/Throat: Oropharynx is clear and moist. No oropharyngeal exudate.  Eyes: Conjunctivae are normal. Pupils are equal, round, and reactive to light.  Neck: Normal range of motion. Neck supple.  Pulmonary/Chest: Effort normal and breath sounds normal. No stridor. No respiratory distress. She has no  wheezes. She has no rales.  Musculoskeletal: Normal range of motion. She exhibits no edema or tenderness.  Lymphadenopathy:    She has no cervical adenopathy.  Neurological: She is alert and oriented to person, place, and time.  Skin: Skin is warm and dry. She is not diaphoretic.  Psychiatric: She has a normal mood and affect. Her behavior is normal. Judgment and thought content normal.  Nursing note and vitals reviewed.   ED  Course  Procedures (including critical care time)  Labs Review Labs Reviewed  RAPID INFLUENZA A&B ANTIGENS ([facility] ONLY)  RAPID STREP SCREEN (NOT AT [facility])  CULTURE, GROUP A STREP [facility])    Imaging Review No results found.   Visual Acuity Review  Right Eye Distance:   Left Eye Distance:   Bilateral Distance:    Right Eye Near:   Left Eye Near:    Bilateral Near:         MDM   1. Acute URI   2. Acute maxillary sinusitis, recurrence not specified    Discharge Medication List as of 06/11/2015  6:53 PM    START taking these medications   Details  azithromycin (ZITHROMAX Z-PAK) 250 MG tablet Use as per package instructions, Normal    benzonatate (TESSALON) 200 MG capsule Take one capsule TID for cough, Normal    chlorpheniramine-HYDROcodone (TUSSIONEX PENNKINETIC ER) 10-8 MG/5ML SUER Take 5 mLs by mouth 2 (two) times daily., Starting 06/11/2015, Until Discontinued, Print    fluticasone (FLONASE) 50 MCG/ACT nasal spray Place 2 sprays into both nostrils daily., Starting 06/11/2015, Until Discontinued, Normal      Plan: 1. Test/x-ray results and diagnosis reviewed with patient 2. rx as per orders; risks, benefits, potential side effects reviewed with patient 3. Recommend supportive treatment with fluids and rest. Patient does have upcoming surgery for evacuation of a hematoma on the buttock will be off for 3 weeks if she is able to give up her Monday trip. Really trying to do that. Has a length of time that she's had the symptoms that continues and is worsening I will begin antibiotic therapy at this time. I will treat her symptomatically. 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 She is not improving and asked her to come follow-up with her primary care 4. F/u prn if symptoms worsen or don't improve     Lutricia Feil, PA-C 06/11/15 1911

## 2015-06-14 LAB — CULTURE, GROUP A STREP (THRC)

## 2015-08-05 ENCOUNTER — Emergency Department (HOSPITAL_COMMUNITY): Payer: 59

## 2015-08-05 ENCOUNTER — Emergency Department (HOSPITAL_COMMUNITY)
Admission: EM | Admit: 2015-08-05 | Discharge: 2015-08-05 | Disposition: A | Discharge status: Home / Self Care | Payer: 59 | Attending: Emergency Medicine | Admitting: Emergency Medicine

## 2015-08-05 ENCOUNTER — Encounter (HOSPITAL_COMMUNITY): Payer: Self-pay | Admitting: *Deleted

## 2015-08-05 DIAGNOSIS — R2242 Localized swelling, mass and lump, left lower limb: Secondary | ICD-10-CM | POA: Insufficient documentation

## 2015-08-05 DIAGNOSIS — M25462 Effusion, left knee: Secondary | ICD-10-CM

## 2015-08-05 DIAGNOSIS — M25561 Pain in right knee: Secondary | ICD-10-CM | POA: Diagnosis present

## 2015-08-05 DIAGNOSIS — Z862 Personal history of diseases of the blood and blood-forming organs and certain disorders involving the immune mechanism: Secondary | ICD-10-CM | POA: Diagnosis not present

## 2015-08-05 DIAGNOSIS — Z79899 Other long term (current) drug therapy: Secondary | ICD-10-CM | POA: Diagnosis not present

## 2015-08-05 DIAGNOSIS — M25562 Pain in left knee: Secondary | ICD-10-CM

## 2015-08-05 MED ORDER — NAPROXEN 500 MG PO TABS: 500.0000 mg | ORAL_TABLET | Freq: Two times a day (BID) | ORAL | Status: DC

## 2015-08-05 MED ORDER — ACETAMINOPHEN-CODEINE #3 300-30 MG PO TABS: 1.0000 | ORAL_TABLET | Freq: Four times a day (QID) | ORAL | Status: DC | PRN

## 2015-08-05 NOTE — ED Notes (Signed)
The pt is c/o of bi-lateral knee pain and swelling for one week   She has been walking on the treadmill recent surgery

## 2015-08-05 NOTE — ED Provider Notes (Signed)
CSN: [id number]     Arrival date & time 08/05/15  0615 History   First MD Initiated Contact with Patient 08/05/15 [phone]     Chief Complaint  Patient presents with  . Knee Pain     (Consider location/radiation/quality/duration/timing/severity/associated sxs/prior Treatment) HPI  This 50 year old female presents emergency Department with chief complaint of knee pain. Patient states he has a history of bilateral knee pain. Patient had a surgery about 4 weeks ago for a soft tissue mass in her gluteal tissue. She states she has not been working out. Prior to the surgery. She is running about 4 miles a day without significant issues with her knees. Patient states she's been trying to get back to working out, has been walking on the treadmill. Since that time she's had significant swelling in her left knee with sensations of locking and catching. She states that her knee pain is fairly severe. She has taken ibuprofen without significant decrease in her pain. She has had difficulty sitting down and standing up. Ambulating is difficult. She denies calf pain, pain or swelling.  Past Medical History  Diagnosis Date  . Iron deficiency anemia    Past Surgical History  Procedure Laterality Date  . Lymphadenectomy     No family history on file. Social History  Substance Use Topics  . Smoking status: Never Smoker   . Smokeless tobacco: Never Used  . Alcohol Use: No   OB History    No data available     Review of Systems  Ten systems reviewed and are negative for acute change, except as noted in the HPI.    Allergies  Review of patient's allergies indicates no known allergies.  Home Medications   Prior to Admission medications   Medication Sig Start Date End Date Taking? Authorizing Provider  azithromycin (ZITHROMAX Z-PAK) 250 MG tablet Use as per package instructions 06/11/15   Lutricia Feil, PA-C  benzonatate (TESSALON) 200 MG capsule Take one capsule TID for cough 06/11/15   Lutricia Feil, PA-C  butalbital-acetaminophen-caffeine (FIORICET) [phone] MG tablet Take 1-2 tablets by mouth every 6 (six) hours as needed for headache. 05/06/15 05/05/16  Fayrene Helper, PA-C  chlorpheniramine-HYDROcodone (TUSSIONEX PENNKINETIC ER) 10-8 MG/5ML SUER Take 5 mLs by mouth 2 (two) times daily. 06/11/15   Lutricia Feil, PA-C  fluticasone (FLONASE) 50 MCG/ACT nasal spray Place 2 sprays into both nostrils daily. 06/11/15   Lutricia Feil, PA-C  levonorgestrel (MIRENA) 20 MCG/24HR IUD 1 each by Intrauterine route once. 09/30/12   Historical Provider, MD  meloxicam (MOBIC) 15 MG tablet Take 1 tablet (15 mg total) by mouth daily. 05/08/15   Payton Mccallum, MD  traMADol (ULTRAM) 50 MG tablet 1 tab po q 8 hours prn 05/08/15   Payton Mccallum, MD   BP 145/85 mmHg  Pulse 73  Temp(Src) 98 F (36.7 C) (Oral)  Resp 18  SpO2 99% Physical Exam Physical Exam  Nursing note and vitals reviewed. Constitutional: She is oriented to person, place, and time. She appears well-developed and well-nourished. No distress.  HENT:  Head: Normocephalic and atraumatic.  Eyes: Conjunctivae normal and EOM are normal. Pupils are equal, round, and reactive to light. No scleral icterus.  Neck: Normal range of motion.  Cardiovascular: Normal rate, regular rhythm and normal heart sounds.  Exam reveals no gallop and no friction rub.   No murmur heard. Pulmonary/Chest: Effort normal and breath sounds normal. No respiratory distress.  Abdominal: Soft. Bowel sounds are normal. She exhibits no distension  and no mass. There is no tenderness. There is no guarding.  Musculoskeletal: Patient with bilateral knee swelling. Worse on the left. Right knee with full range of motion, no tenderness palpation, ligaments are stable. Left knee with decreased range of motion. She has full extension, flexion is difficult past 90. Tender along the medial joint line. Ligaments are stable.  Neurological: She is alert and oriented to person, place, and  time.  Skin: Skin is warm and dry. She is not diaphoretic.    ED Course  Procedures (including critical care time) Labs Review Labs Reviewed - No data to display  Imaging Review Dg Knee Complete 4 Views Left  08/05/2015  CLINICAL DATA:  Injury at gym, bilateral knee pain left greater than right EXAM: LEFT KNEE - COMPLETE 4+ VIEW COMPARISON:  11/26/2013 FINDINGS: Four views of the left knee submitted. There is mild narrowing of medial joint compartment. Mild spurring of medial femoral condyle and medial tibial plateau. Moderate joint effusion. No acute fracture or subluxation. IMPRESSION: No acute fracture or subluxation. Degenerative changes as described above. Moderate joint effusion. Electronically Signed   By: Natasha MeadLiviu  Pop M.D.   On: 08/05/2015 08:18   Dg Knee Complete 4 Views Right  08/05/2015  CLINICAL DATA:  Bilateral knee pain after injury at gym. EXAM: RIGHT KNEE - COMPLETE 4+ VIEW COMPARISON:  October 02, 2009. FINDINGS: There is no evidence of fracture, dislocation, or joint effusion. No significant joint space narrowing is noted. Osteophyte formation is noted medially. Soft tissues are unremarkable. IMPRESSION: Mild degenerative changes are noted. No acute abnormality seen in the right knee. Electronically Signed   By: Lupita RaiderJames  Green Jr, M.D.   On: 08/05/2015 08:19   I have personally reviewed and evaluated these images and lab results as part of my medical decision-making.   EKG Interpretation None      MDM   Final diagnoses:  Pain and swelling of knee, left    With bilateral knee pain, worse on the left with swelling. She is complaining of mechanical symptoms. I suspect she may have a meniscal tear. Patient will be given an knee sleeve, I have suggested anti-inflammatories, icing, and follow up with orthopedics. Patient expresses her understanding and agrees with plan of care. I doubt emergent cause of knee swelling such as infection,    Arthor Captainbigail Jakyri Brunkhorst, PA-C 08/05/15  1727  Glynn OctaveStephen Rancour, MD 08/05/15 1737

## 2015-08-05 NOTE — Discharge Instructions (Signed)
How to Use a Knee Brace A knee brace is a device that you wear to support your knee, especially if the knee is healing after an injury or surgery. There are several types of knee braces. Some are designed to prevent an injury (prophylactic brace). These are often worn during sports. Others support an injured knee (functional brace) or keep it still while it heals (rehabilitative brace). People with severe arthritis of the knee may benefit from a brace that takes some pressure off the knee (unloader brace). Most knee braces are made from a combination of cloth and metal or plastic.  You may need to wear a knee brace to:  Relieve knee pain.  Help your knee support your weight (improve stability).  Help you walk farther (improve mobility).  Prevent injury.  Support your knee while it heals from surgery or from an injury. RISKS AND COMPLICATIONS Generally, knee braces are very safe to wear. However, problems may occur, including:  Skin irritation that may lead to infection.  Making your condition worse if you wear the brace in the wrong way. HOW TO USE A KNEE BRACE Different braces will have different instructions for use. Your health care provider will tell you or show you:  How to put on your brace.  How to adjust the brace.  When and how often to wear the brace.  How to remove the brace.  If you will need any assistive devices in addition to the brace, such as crutches or a cane. In general, your brace should:  Have the hinge of the brace line up with the bend of your knee.  Have straps, hooks, or tapes that fasten snugly around your leg.  Not feel too tight or too loose. HOW TO CARE FOR A KNEE BRACE  Check your brace often for signs of damage, such as loose connections or attachments. Your knee brace may get damaged or wear out during normal use.  Wash the fabric parts of your brace with soap and water.  Read the insert that comes with your brace for other specific care  instructions. SEEK MEDICAL CARE IF:  Your knee brace is too loose or too tight and you cannot adjust it.  Your knee brace causes skin redness, swelling, bruising, or irritation.  Your knee brace is not helping.  Your knee brace is making your knee pain worse.   This information is not intended to replace advice given to you by your health care provider. Make sure you discuss any questions you have with your health care provider.   Document Released: 07/01/2003 Document Revised: 12/30/2014 Document Reviewed: 08/03/2014 Elsevier Interactive Patient Education 2016 [place].  Cryotherapy Cryotherapy means treatment with cold. Ice or gel packs can be used to reduce both pain and swelling. Ice is the most helpful within the first 24 to 48 hours after an injury or flare-up from overusing a muscle or joint. Sprains, strains, spasms, burning pain, shooting pain, and aches can all be eased with ice. Ice can also be used when recovering from surgery. Ice is effective, has very few side effects, and is safe for most people to use. PRECAUTIONS  Ice is not a safe treatment option for people with:  Raynaud phenomenon. This is a condition affecting small blood vessels in the extremities. Exposure to cold may cause your problems to return.  Cold hypersensitivity. There are many forms of cold hypersensitivity, including:  Cold urticaria. Red, itchy hives appear on the skin when the tissues begin to warm  after being iced.  Cold erythema. This is a red, itchy rash caused by exposure to cold.  Cold hemoglobinuria. Red blood cells break down when the tissues begin to warm after being iced. The hemoglobin that carry oxygen are passed into the urine because they cannot combine with blood proteins fast enough.  Numbness or altered sensitivity in the area being iced. If you have any of the following conditions, do not use ice until you have discussed cryotherapy with your caregiver:  Heart conditions,  such as arrhythmia, angina, or chronic heart disease.  High blood pressure.  Healing wounds or open skin in the area being iced.  Current infections.  Rheumatoid arthritis.  Poor circulation.  Diabetes. Ice slows the blood flow in the region it is applied. This is beneficial when trying to stop inflamed tissues from spreading irritating chemicals to surrounding tissues. However, if you expose your skin to cold temperatures for too long or without the proper protection, you can damage your skin or nerves. Watch for signs of skin damage due to cold. HOME CARE INSTRUCTIONS Follow these tips to use ice and cold packs safely.  Place a dry or damp towel between the ice and skin. A damp towel will cool the skin more quickly, so you may need to shorten the time that the ice is used.  For a more rapid response, add gentle compression to the ice.  Ice for no more than 10 to 20 minutes at a time. The bonier the area you are icing, the less time it will take to get the benefits of ice.  Check your skin after 5 minutes to make sure there are no signs of a poor response to cold or skin damage.  Rest 20 minutes or more between uses.  Once your skin is numb, you can end your treatment. You can test numbness by very lightly touching your skin. The touch should be so light that you do not see the skin dimple from the pressure of your fingertip. When using ice, most people will feel these normal sensations in this order: cold, burning, aching, and numbness.  Do not use ice on someone who cannot communicate their responses to pain, such as small children or people with dementia. HOW TO MAKE AN ICE PACK Ice packs are the most common way to use ice therapy. Other methods include ice massage, ice baths, and cryosprays. Muscle creams that cause a cold, tingly feeling do not offer the same benefits that ice offers and should not be used as a substitute unless recommended by your caregiver. To make an ice  pack, do one of the following:  Place crushed ice or a bag of frozen vegetables in a sealable plastic bag. Squeeze out the excess air. Place this bag inside another plastic bag. Slide the bag into a pillowcase or place a damp towel between your skin and the bag.  Mix 3 parts water with 1 part rubbing alcohol. Freeze the mixture in a sealable plastic bag. When you remove the mixture from the freezer, it will be slushy. Squeeze out the excess air. Place this bag inside another plastic bag. Slide the bag into a pillowcase or place a damp towel between your skin and the bag. SEEK MEDICAL CARE IF:  You develop white spots on your skin. This may give the skin a blotchy (mottled) appearance.  Your skin turns blue or pale.  Your skin becomes waxy or hard.  Your swelling gets worse. MAKE SURE YOU:   Understand  these instructions.  Will watch your condition.  Will get help right away if you are not doing well or get worse.   This information is not intended to replace advice given to you by your health care provider. Make sure you discuss any questions you have with your health care provider.   Document Released: 12/05/2010 Document Revised: 05/01/2014 Document Reviewed: 12/05/2010 Elsevier Interactive Patient Education 2016 [employer] Meniscus Tear A meniscus tear is a knee injury in which a piece of the meniscus is torn. The meniscus is a thick, rubbery, wedge-shaped cartilage in the knee. Two menisci are located in each knee. They sit between the upper bone (femur) and lower bone (tibia) that make up the knee joint. Each meniscus acts as a shock absorber for the knee. A torn meniscus is one of the most common types of knee injuries. This injury can range from mild to severe. Surgery may be needed for a severe tear. CAUSES This injury may be caused by any squatting, twisting, or pivoting movement. Sports-related injuries are the most common cause. These often occur from:  Running and  stopping suddenly.  Changing direction.  Being tackled or knocked off your feet. As people get older, their meniscus gets thinner and weaker. In these people, tears can happen more easily, such as from climbing stairs.  RISK FACTORS This injury is more likely to happen to:  People who play contact sports.  Males.  People who are 3730-51 years of age. SYMPTOMS  Symptoms of this injury include:  Knee pain, especially at the side of the knee joint. You may feel pain when the injury occurs, or you may only hear a pop and feel pain later.  A feeling that your knee is clicking, catching, locking, or giving way.  Not being able to fully bend or extend your knee.  Bruising or swelling in your knee. DIAGNOSIS  This injury may be diagnosed based on your symptoms and a physical exam. The physical exam may include:  Moving your knee in different ways.  Feeling for tenderness.  Listening for a clicking sound.  Checking if your knee locks or catches. You may also have tests, such as:  X-rays.  MRI.  A procedure to look inside your knee with a narrow surgical telescope (arthroscopy). You may be referred to a knee specialist (orthopedic surgeon). TREATMENT  Treatment for this injury depends on the severity of the tear. Treatment for a mild tear may include:  Rest.  Medicine to reduce pain and swelling. This is usually a nonsteroidal anti-inflammatory drug (NSAID).  A knee brace or an elastic sleeve or wrap.  Using crutches or a walker to keep weight off your knee and to help you walk.  Exercises to strengthen your knee (physical therapy). You may need surgery if you have a severe tear or if other treatments are not working.  HOME CARE INSTRUCTIONS Managing Pain and Swelling  Take over-the-counter and prescription medicines only as told by your health care provider.  If directed, apply ice to the injured area:  Put ice in a plastic bag.  Place a towel between your skin  and the bag.  Leave the ice on for 20 minutes, 2-3 times per day.  Raise (elevate) the injured area above the level of your heart while you are sitting or lying down. Activity  Do not use the injured limb to support your body weight until your health care provider says that you can. Use crutches or a walker as told  by your health care provider.  Return to your normal activities as told by your health care provider. Ask your health care provider what activities are safe for you.  Perform range-of-motion exercises only as told by your health care provider.  Begin doing exercises to strengthen your knee and leg muscles only as told by your health care provider. After you recover, your health care provider may recommend these exercises to help prevent another injury. General Instructions  Use a knee brace or elastic wrap as told by your health care provider.  Keep all follow-up visits as told by your health care provider. This is important. SEEK MEDICAL CARE IF:  You have a fever.  Your knee becomes red, tender, or swollen.  Your pain medicine is not helping.  Your symptoms get worse or do not improve after 2 weeks of home care.   This information is not intended to replace advice given to you by your health care provider. Make sure you discuss any questions you have with your health care provider.   Document Released: 07/01/2002 Document Revised: 12/30/2014 Document Reviewed: 08/03/2014 Elsevier Interactive Patient Education [employer]

## 2015-08-05 NOTE — ED Notes (Signed)
Patient transported to X-ray 

## 2015-12-02 ENCOUNTER — Ambulatory Visit
Admission: EM | Admit: 2015-12-02 | Discharge: 2015-12-02 | Disposition: A | Discharge status: Home / Self Care | Payer: 59 | Attending: Family Medicine | Admitting: Family Medicine

## 2015-12-02 ENCOUNTER — Encounter: Payer: Self-pay | Admitting: *Deleted

## 2015-12-02 DIAGNOSIS — Z7251 High risk heterosexual behavior: Secondary | ICD-10-CM

## 2015-12-02 DIAGNOSIS — A499 Bacterial infection, unspecified: Secondary | ICD-10-CM

## 2015-12-02 DIAGNOSIS — Z202 Contact with and (suspected) exposure to infections with a predominantly sexual mode of transmission: Secondary | ICD-10-CM

## 2015-12-02 DIAGNOSIS — N760 Acute vaginitis: Secondary | ICD-10-CM

## 2015-12-02 DIAGNOSIS — B9689 Other specified bacterial agents as the cause of diseases classified elsewhere: Secondary | ICD-10-CM

## 2015-12-02 LAB — URINALYSIS COMPLETE WITH MICROSCOPIC (ARMC ONLY)
BILIRUBIN URINE: NEGATIVE
GLUCOSE, UA: NEGATIVE mg/dL
HGB URINE DIPSTICK: NEGATIVE
KETONES UR: NEGATIVE mg/dL
LEUKOCYTES UA: NEGATIVE
Nitrite: NEGATIVE
PH: 7 (ref 5.0–8.0)
PROTEIN: NEGATIVE mg/dL
Specific Gravity, Urine: 1.02 (ref 1.005–1.030)

## 2015-12-02 LAB — WET PREP, GENITAL
Sperm: NONE SEEN
Trich, Wet Prep: NONE SEEN
Yeast Wet Prep HPF POC: NONE SEEN

## 2015-12-02 LAB — CHLAMYDIA/NGC RT PCR (ARMC ONLY)
CHLAMYDIA TR: NOT DETECTED
N gonorrhoeae: NOT DETECTED

## 2015-12-02 MED ORDER — METRONIDAZOLE 500 MG PO TABS: 500.0000 mg | ORAL_TABLET | Freq: Two times a day (BID) | ORAL | 0 refills | Status: DC

## 2015-12-02 MED ORDER — KETOCONAZOLE 2 % EX CREA: 1.0000 "application " | TOPICAL_CREAM | Freq: Two times a day (BID) | CUTANEOUS | 3 refills | Status: DC

## 2015-12-02 MED ORDER — FLUCONAZOLE 150 MG PO TABS: 150.0000 mg | ORAL_TABLET | Freq: Once | ORAL | 0 refills | Status: AC

## 2015-12-02 NOTE — ED Triage Notes (Signed)
Patient is concerned that she may have a STD and wants to double check.  She is presently not having any symptoms and has not knowingly come in contact with a STD.

## 2015-12-02 NOTE — ED Provider Notes (Addendum)
[facility]    CSN: [id number] Arrival date & time: 12/02/15  1021  First Provider Contact:  First MD Initiated Contact with Patient 12/02/15 1101        History   Chief Complaint Chief Complaint  Patient presents with  . SEXUALLY TRANSMITTED DISEASE    HPI Linda Randolph is a 50 y.o. female.   Patient 50 year old black female who broke up with long-standing partner almost a month ago. In the interim his wrist and things as far as STDs has become very concerned and very paranoid in fact about STD exposures. Because of her concern and worry she wants to be tested for STDs. She is also worried about herpes we talked this very difficult to check for herpes versus herpetic lesion. She has had a little bit of vaginal discharge but nothing overtly significant. She is had a history of menorrhagia and anemia and currently has a Mirena IUD which is help control that. She does have an occasional hot flash but appears and not nearly as regular as a used to be.   No known drug allergies. She is not currently on any medications. Patient denies of smoking and is no pertinent family medical history pertaining to today's visit.     The history is provided by the patient. No language interpreter was used.  Exposure to STD  This is a new problem. The current episode started more than 1 week ago. The problem has not changed since onset.Pertinent negatives include no chest pain, no abdominal pain and no headaches. Nothing aggravates the symptoms. Nothing relieves the symptoms. She has tried nothing for the symptoms. The treatment provided no relief.    Past Medical History:  Diagnosis Date  . Iron deficiency anemia     There are no active problems to display for this patient.   Past Surgical History:  Procedure Laterality Date  . LYMPHADENECTOMY      OB History    No data available       Home Medications    Prior to Admission medications   Medication Sig Start  Date End Date Taking? Authorizing Provider  levonorgestrel (MIRENA) 20 MCG/24HR IUD 1 each by Intrauterine route once. 09/30/12  Yes Historical Provider, MD  acetaminophen-codeine (TYLENOL #3) 300-30 MG tablet Take 1-2 tablets by mouth every 6 (six) hours as needed for moderate pain. 08/05/15   Arthor Captain, PA-C  azithromycin (ZITHROMAX Z-PAK) 250 MG tablet Use as per package instructions 06/11/15   Lutricia Feil, PA-C  benzonatate (TESSALON) 200 MG capsule Take one capsule TID for cough 06/11/15   Lutricia Feil, PA-C  butalbital-acetaminophen-caffeine (FIORICET) [phone] MG tablet Take 1-2 tablets by mouth every 6 (six) hours as needed for headache. 05/06/15 05/05/16  Fayrene Helper, PA-C  fluconazole (DIFLUCAN) 150 MG tablet Take 1 tablet (150 mg total) by mouth once. 12/02/15 12/02/15  Hassan Rowan, MD  fluticasone (FLONASE) 50 MCG/ACT nasal spray Place 2 sprays into both nostrils daily. 06/11/15   Lutricia Feil, PA-C  metroNIDAZOLE (FLAGYL) 500 MG tablet Take 1 tablet (500 mg total) by mouth 2 (two) times daily. 12/02/15   Hassan Rowan, MD  naproxen (NAPROSYN) 500 MG tablet Take 1 tablet (500 mg total) by mouth 2 (two) times daily. 08/05/15   Arthor Captain, PA-C    Family History History reviewed. No pertinent family history.  Social History Social History  Substance Use Topics  . Smoking status: Never Smoker  . Smokeless tobacco: Never Used  . Alcohol use  No     Allergies   Review of patient's allergies indicates no known allergies.   Review of Systems Review of Systems  Cardiovascular: Negative for chest pain.  Gastrointestinal: Negative for abdominal pain.  Neurological: Negative for headaches.  All other systems reviewed and are negative.    Physical Exam Triage Vital Signs ED Triage Vitals  Enc Vitals Group     BP 12/02/15 1049 (!) 126/98     Pulse Rate 12/02/15 1049 79     Resp 12/02/15 1049 18     Temp 12/02/15 1049 98.3 F (36.8 C)     Temp src --      SpO2  12/02/15 1049 100 %     Weight 12/02/15 1049 185 lb (83.9 kg)     Height 12/02/15 1049  (1.778 m)     Head Circumference --      Peak Flow --      Pain Score 12/02/15 1059 0     Pain Loc --      Pain Edu? --      Excl. in GC? --    No data found.   Updated Vital Signs BP (!) 126/98   Pulse 79   Temp 98.3 F (36.8 C)   Resp 18   Ht  (1.778 m)   Wt 185 lb (83.9 kg)   SpO2 100%   BMI 26.54 kg/m   Visual Acuity Right Eye Distance:   Left Eye Distance:   Bilateral Distance:    Right Eye Near:   Left Eye Near:    Bilateral Near:     Physical Exam  Constitutional: She is oriented to person, place, and time. She appears well-developed and well-nourished.  HENT:  Head: Normocephalic and atraumatic.  Eyes: EOM are normal. Pupils are equal, round, and reactive to light.  Neck: Normal range of motion. No tracheal deviation present.  Pulmonary/Chest: Effort normal.  Abdominal: Soft. Bowel sounds are normal. She exhibits no distension. There is no tenderness.  Genitourinary: Uterus normal. Vaginal discharge found.  Musculoskeletal: Normal range of motion.  Neurological: She is alert and oriented to person, place, and time. She has normal reflexes.  Skin: Skin is warm.  Psychiatric: She has a normal mood and affect.  Vitals reviewed.    UC Treatments / Results  Labs (all labs ordered are listed, but only abnormal results are displayed) Labs Reviewed  WET PREP, GENITAL - Abnormal; Notable for the following:       Result Value   Clue Cells Wet Prep HPF POC PRESENT (*)    WBC, Wet Prep HPF POC FEW (*)    All other components within normal limits  URINALYSIS COMPLETEWITH MICROSCOPIC ([facility] ONLY) - Abnormal; Notable for the following:    APPearance CLOUDY (*)    Bacteria, UA FEW (*)    Squamous Epithelial / LPF 6-30 (*)    All other components within normal limits  CHLAMYDIA/NGC RT PCR ([facility] ONLY)  RPR  HIV ANTIBODY (ROUTINE TESTING)    EKG   EKG Interpretation None       Radiology No results found.  Procedures Procedures (including critical care time)  Medications Ordered in UC Medications - No data to display  Results for orders placed or performed during the hospital encounter of 12/02/15  Wet prep, genital  Result Value Ref Range   Yeast Wet Prep HPF POC NONE SEEN NONE SEEN   Trich, Wet Prep NONE SEEN NONE SEEN   Clue Cells Wet Prep  HPF POC PRESENT (A) NONE SEEN   WBC, Wet Prep HPF POC FEW (A) NONE SEEN   Sperm NONE SEEN   Urinalysis complete, with microscopic  Result Value Ref Range   Color, Urine YELLOW YELLOW   APPearance CLOUDY (A) CLEAR   Glucose, UA NEGATIVE NEGATIVE mg/dL   Bilirubin Urine NEGATIVE NEGATIVE   Ketones, ur NEGATIVE NEGATIVE mg/dL   Specific Gravity, Urine 1.020 1.005 - 1.030   Hgb urine dipstick NEGATIVE NEGATIVE   pH 7.0 5.0 - 8.0   Protein, ur NEGATIVE NEGATIVE mg/dL   Nitrite NEGATIVE NEGATIVE   Leukocytes, UA NEGATIVE NEGATIVE   RBC / HPF 0-5 0 - 5 RBC/hpf   WBC, UA 0-5 0 - 5 WBC/hpf   Bacteria, UA FEW (A) NONE SEEN   Squamous Epithelial / LPF 6-30 (A) NONE SEEN   Initial Impression / Assessment and Plan / UC Course  I have reviewed the triage vital signs and the nursing notes.  Pertinent labs & imaging results that were available during my care of the patient were reviewed by me and considered in my medical decision making (see chart for details).     I've explained to her why we can't test herpes unnecessarily active lesion. Will give her information as far as wet prep and KOH explained to her also that GC chlamydia HIV and RPR test will have to come back later for results.  Final Clinical Impressions(s) / UC Diagnoses   Final diagnoses:  Bacterial vaginitis  Possible exposure to STD  History of unprotected sex    New Prescriptions New Prescriptions   FLUCONAZOLE (DIFLUCAN) 150 MG TABLET    Take 1 tablet (150 mg total) by mouth once.   METRONIDAZOLE (FLAGYL) 500  MG TABLET    Take 1 tablet (500 mg total) by mouth 2 (two) times daily.    Treatment Flagyl 500 mg twice day for 10 days also add Diflucan. Recommend using protection the future. Will notify patient with results when they're available. Urinalysis was not significant in that it was contaminated.   After explaining things to patient she is informing me that she has a problem with her right toenail she's losing the nail. The nail. He has a recurrent fungal infection will colonize or cream for her as well the place on the nailbed to use twice a day for least 2 weeks.Hassan Rowan. Kayveon Lennartz, MD 12/02/15 1235    Hassan RowanEugene Draven Laine, MD 12/02/15 [phone]

## 2015-12-03 LAB — HIV ANTIBODY (ROUTINE TESTING W REFLEX): HIV Screen 4th Generation wRfx: NONREACTIVE

## 2015-12-03 LAB — RPR: RPR Ser Ql: NONREACTIVE

## 2015-12-08 ENCOUNTER — Telehealth: Payer: Self-pay | Admitting: *Deleted

## 2015-12-08 NOTE — Telephone Encounter (Signed)
Patient returned phone message and was informed that all of her test results came back negative. Patient confirmed understanding of information.

## 2017-03-13 ENCOUNTER — Other Ambulatory Visit: Payer: Self-pay

## 2017-03-13 ENCOUNTER — Encounter: Payer: Self-pay | Admitting: Emergency Medicine

## 2017-03-13 ENCOUNTER — Ambulatory Visit
Admission: EM | Admit: 2017-03-13 | Discharge: 2017-03-13 | Disposition: A | Discharge status: Home / Self Care | Payer: 59 | Attending: Family Medicine | Admitting: Family Medicine

## 2017-03-13 DIAGNOSIS — H6690 Otitis media, unspecified, unspecified ear: Secondary | ICD-10-CM

## 2017-03-13 MED ORDER — AMOXICILLIN-POT CLAVULANATE 875-125 MG PO TABS: 1.0000 | ORAL_TABLET | Freq: Two times a day (BID) | ORAL | 0 refills | Status: DC

## 2017-03-13 NOTE — ED Provider Notes (Signed)
[facility]    CSN: [id number] Arrival date & time: 03/13/17  1503  History   Chief Complaint Chief Complaint  Patient presents with  . Otalgia   HPI  51 year old female presents with several complaints.  Patient states that she has been sick for about 3-4 days.  She has had bilateral ear pain, right greater than left.  Also reports postnasal drip, chills, and congestion.  She is a Financial controllerflight attendant.  Is been using over-the-counter medication without improvement.  No known exacerbating relieving factors.  Of course she has had numerous sick contacts given her occupation.  She reports fever but did not take her temperature.  Additionally, she reports ongoing issues with knee pain.  Right greater than left.  Seems to be worse with activity.  She is exercising 5-6 times a week. She is running, jogging.  Past Medical History:  Diagnosis Date  . Iron deficiency anemia    Past Surgical History:  Procedure Laterality Date  . LYMPHADENECTOMY     OB History    No data available     Home Medications    Prior to Admission medications   Medication Sig Start Date End Date Taking? Authorizing Provider  amoxicillin-clavulanate (AUGMENTIN) 875-125 MG tablet Take 1 tablet by mouth every 12 (twelve) hours. 03/13/17   Tommie Samsook, Jessejames Steelman G, DO  levonorgestrel (MIRENA) 20 MCG/24HR IUD 1 each by Intrauterine route once. 09/30/12   [provider]    Family History Family History  Problem Relation Age of Onset  . Hypertension Mother   . Hypertension Father     Social History Social History   Tobacco Use  . Smoking status: Former Smoker    Last attempt to quit: 03/13/2005    Years since quitting: 12.0  . Smokeless tobacco: Never Used  Substance Use Topics  . Alcohol use: No  . Drug use: No     Allergies   Patient has no known allergies.   Review of Systems Review of Systems  Constitutional: Positive for fever.  HENT: Positive for congestion, ear pain, postnasal  drip and sore throat.   Musculoskeletal:       Knee pain.   Physical Exam Triage Vital Signs ED Triage Vitals  Enc Vitals Group     BP 03/13/17 1513 (!) 134/91     Pulse Rate 03/13/17 1513 78     Resp 03/13/17 1513 16     Temp 03/13/17 1513 98 F (36.7 C)     Temp Source 03/13/17 1513 Oral     SpO2 03/13/17 1513 99 %     Weight 03/13/17 1513 200 lb (90.7 kg)     Height 03/13/17 1513 5' 9.5" (1.765 m)     Head Circumference --      Peak Flow --      Pain Score 03/13/17 1514 10     Pain Loc --      Pain Edu? --      Excl. in GC? --    Updated Vital Signs BP (!) 134/91 (BP Location: Left Arm)   Pulse 78   Temp 98 F (36.7 C) (Oral)   Resp 16   Ht 5' 9.5" (1.765 m)   Wt 200 lb (90.7 kg)   SpO2 99%   BMI 29.11 kg/m   Physical Exam  Constitutional: She is oriented to person, place, and time. She appears well-developed. No distress.  HENT:  Head: Normocephalic and atraumatic.  Oropharynx clear. Left TM with moderate erythema. Right TM with  mild erythema and bulging.  Cardiovascular: Normal rate and regular rhythm.  No murmur heard. Pulmonary/Chest: Effort normal and breath sounds normal. She has no wheezes. She has no rales.  Musculoskeletal:  Right knee -effusion noted.  Crepitus with range of motion.  Medial and lateral joint line tenderness.  Ligaments intact.  Neurological: She is alert and oriented to person, place, and time.  Skin: Skin is warm. No rash noted.  Psychiatric: She has a normal mood and affect. Her behavior is normal.  Vitals reviewed.  UC Treatments / Results  Labs (all labs ordered are listed, but only abnormal results are displayed) Labs Reviewed - No data to display  EKG  EKG Interpretation None       Radiology No results found.  Procedures Procedures (including critical care time)  Medications Ordered in UC Medications - No data to display   Initial Impression / Assessment and Plan / UC Course  I have reviewed the triage  vital signs and the nursing notes.  Pertinent labs & imaging results that were available during my care of the patient were reviewed by me and considered in my medical decision making (see chart for details).     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  Early otitis media.  Given her exam, will treat with Augmentin.  Regarding her knee pain, I advised rest and change in her exercise habits.  NSAIDs as needed  Final Clinical Impressions(s) / UC Diagnoses   Final diagnoses:  Acute otitis media, unspecified otitis media type    ED Discharge Orders        Ordered    amoxicillin-clavulanate (AUGMENTIN) 875-125 MG tablet  Every 12 hours     03/13/17 1535     Controlled Substance Prescriptions [place] Controlled Substance Registry consulted? Not Applicable   Tommie SamsCook, Ryiah Bellissimo G, DO 03/13/17 1549

## 2017-03-13 NOTE — ED Triage Notes (Signed)
Patient in today c/o 3 day history of bilateral ear pain R>L, post nasal drip, chills, chest congestion. 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 Patient has tried OTC ear drops and Advil cold and sinus.

## 2017-03-13 NOTE — Discharge Instructions (Signed)
Antibiotic as prescribed.  Rest, ibuprofen for the knee. Cut back on the exercise. Try bike/elliptical/pool.  Take care  Dr. Adriana Simasook

## 2017-03-15 ENCOUNTER — Emergency Department (HOSPITAL_BASED_OUTPATIENT_CLINIC_OR_DEPARTMENT_OTHER): Admit: 2017-03-15 | Discharge: 2017-03-15 | Disposition: A | Discharge status: Home / Self Care | Payer: 59

## 2017-03-15 ENCOUNTER — Other Ambulatory Visit: Payer: Self-pay

## 2017-03-15 ENCOUNTER — Encounter (HOSPITAL_COMMUNITY): Payer: Self-pay | Admitting: Emergency Medicine

## 2017-03-15 ENCOUNTER — Emergency Department (HOSPITAL_COMMUNITY)
Admission: EM | Admit: 2017-03-15 | Discharge: 2017-03-15 | Disposition: A | Discharge status: Home / Self Care | Payer: 59 | Attending: Emergency Medicine | Admitting: Emergency Medicine

## 2017-03-15 DIAGNOSIS — M7121 Synovial cyst of popliteal space [Baker], right knee: Secondary | ICD-10-CM

## 2017-03-15 DIAGNOSIS — M79609 Pain in unspecified limb: Secondary | ICD-10-CM

## 2017-03-15 DIAGNOSIS — M79604 Pain in right leg: Secondary | ICD-10-CM | POA: Diagnosis not present

## 2017-03-15 DIAGNOSIS — Z87891 Personal history of nicotine dependence: Secondary | ICD-10-CM | POA: Diagnosis not present

## 2017-03-15 MED ORDER — PREDNISONE 20 MG PO TABS
60.0000 mg | ORAL_TABLET | Freq: Once | ORAL | Status: AC
  Administered 2017-03-15: 60 mg via ORAL
  Filled 2017-03-15: qty 3

## 2017-03-15 MED ORDER — OXYCODONE-ACETAMINOPHEN 5-325 MG PO TABS
1.0000 | ORAL_TABLET | Freq: Once | ORAL | Status: AC
  Administered 2017-03-15: 1 via ORAL
  Filled 2017-03-15: qty 1

## 2017-03-15 MED ORDER — PREDNISONE 10 MG PO TABS: 40.0000 mg | ORAL_TABLET | Freq: Every day | ORAL | 0 refills | Status: AC

## 2017-03-15 MED ORDER — OXYCODONE HCL 5 MG PO TABS: 5.0000 mg | ORAL_TABLET | Freq: Four times a day (QID) | ORAL | 0 refills | Status: AC | PRN

## 2017-03-15 NOTE — ED Triage Notes (Signed)
Rt leg pan x 2 weeks has been taking tylenol and it has not helped states sharp pain in  Foot calf and knee, pain denies injury, states feels like it swelling, hurts to walk on it

## 2017-03-15 NOTE — Progress Notes (Signed)
VASCULAR LAB PRELIMINARY  PRELIMINARY  PRELIMINARY  PRELIMINARY  Right lower extremity venous duplex completed.    Preliminary report:  There is no DVT or SVT noted in the right lower extremity. There is a large, intact Baker's cyst noted in the right popliteal fossa.    Called report to [place] F[place]ze, [place]  [place], [facility], RVT 03/15/2017, 2:39 PM

## 2017-03-15 NOTE — Discharge Instructions (Addendum)
Take prednisone as prescribed beginning tomorrow, you received the first dose in the emergency department today.  Do not take ibuprofen or Motrin while on this medication.  You may take Tylenol.  After that, you may alternate 600 mg of ibuprofen and [phone] mg of Tylenol every 3 hours as needed for pain. Do not exceed 4000 mg of Tylenol daily.  Apply ice or heat for comfort.  Apply compression with knee sleeve for comfort.  Follow-up with primary care physician or orthopedist for reevaluation of your symptoms.  Return to the ED if any concerning signs or symptoms develop.

## 2017-03-15 NOTE — ED Provider Notes (Signed)
[facility]   CSN: [id number] Arrival date & time: 03/15/17  1136     History   Chief Complaint Chief Complaint  Patient presents with  . Leg Pain    HPI Linda Randolph is a 51 y.o. female who presents today with chief complaint gradual onset, progressively worsening right lower extremity pain for 3 weeks.  Pain has been constant and sharp, worsens with weightbearing and ambulation.  Pain is worst in the mornings when she awakes.  She endorses swelling of the right knee and lower extremity.  Pain is primarily localized to the knee and calf with radiation down to the toes and at times up to the hip.  Endorses intermittent numbness and tingling of her right foot.  She denies fevers, chills, chest pain, shortness of breath, abdominal pain, nausea, vomiting, bowel or bladder, or back pain.  She denies any trauma or falls.  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  She does use Mirena as her hormonal birth control.  No prior history of DVT or PE.  She has tried ibuprofen and Tylenol for her symptoms without significant relief.  The history is provided by the patient.    Past Medical History:  Diagnosis Date  . Iron deficiency anemia     There are no active problems to display for this patient.   Past Surgical History:  Procedure Laterality Date  . LYMPHADENECTOMY      OB History    No data available       Home Medications    Prior to Admission medications   Medication Sig Start Date End Date Taking? Authorizing Provider  levonorgestrel (MIRENA) 20 MCG/24HR IUD 1 each by Intrauterine route once. 09/30/12  Yes [provider]  oxyCODONE (ROXICODONE) 5 MG immediate release tablet Take 1 tablet (5 mg total) by mouth every 6 (six) hours as needed for severe pain. 03/15/17   Aliz Meritt A, PA-C  predniSONE (DELTASONE) 10 MG tablet Take 4 tablets (40 mg total)  by mouth daily with breakfast for 5 days. 03/15/17 03/20/17  Jeanie SewerFawze, Taysean Wager A, PA-C    Family History Family History  Problem Relation Age of Onset  . Hypertension Mother   . Hypertension Father     Social History Social History   Tobacco Use  . Smoking status: Former Smoker    Last attempt to quit: 03/13/2005    Years since quitting: 12.0  . Smokeless tobacco: Never Used  Substance Use Topics  . Alcohol use: No  . Drug use: No     Allergies   Patient has no known allergies.   Review of Systems Review of Systems  Constitutional: Negative for chills and fever.  Respiratory: Negative for shortness of breath.   Cardiovascular: Negative for chest pain.  Gastrointestinal: Negative for abdominal pain, nausea and vomiting.  Musculoskeletal: Positive for arthralgias, joint swelling and myalgias.  Neurological: Positive for numbness. Negative for syncope and weakness.  All other systems reviewed and are negative.    Physical Exam Updated Vital Signs BP 131/74 (BP Location: Right Arm)   Pulse 68   Temp 98.4 F (36.9 C) (Oral)   Resp 16   SpO2 100%   Physical Exam  Constitutional: She appears well-developed and well-nourished. No distress.  HENT:  Head: Normocephalic and atraumatic.  Eyes: Conjunctivae are normal. Right eye exhibits no discharge. Left eye exhibits no discharge.  Neck: Normal range  of motion. Neck supple. No JVD present. No tracheal deviation present.  Cardiovascular: Normal rate and intact distal pulses.  2+ DP/PT pulses bilaterally, mild 1+ nonpitting edema to the right lower extremity.  Right calf tender to palpation, Homans sign present on the right  Pulmonary/Chest: Effort normal.  Abdominal: She exhibits no distension.  Musculoskeletal: She exhibits edema and tenderness.  Mild edema anteriorly to the right knee.  Generalized tenderness to palpation of the medial and lateral joint lines, MCL, LCL, and patella.  Slightly decreased range of motion with  flexion of the right knee secondary to pain.  Negative anterior/posterior drawer test, no varus or valgus instability.  5/5 strength of BLE major muscle groups.  Generalized tenderness to palpation of the right lower extremity from the calf to the ankle.  Neurological: She is alert. A sensory deficit is present.  Fluent speech, no facial droop, slightly altered sensation of the right lower extremity from the knee to the toes.  Slightly antalgic gait, but able to Heel Walk and Toe Walk without difficulty.  Skin: Skin is warm and dry. No erythema.  Psychiatric: She has a normal mood and affect. Her behavior is normal.  Nursing note and vitals reviewed.    ED Treatments / Results  Labs (all labs ordered are listed, but only abnormal results are displayed) Labs Reviewed - No data to display  EKG  EKG Interpretation None       Radiology No results found.  Procedures Procedures (including critical care time)  Medications Ordered in ED Medications  oxyCODONE-acetaminophen (PERCOCET/ROXICET) 5-325 MG per tablet 1 tablet (1 tablet Oral Given 03/15/17 1217)  predniSONE (DELTASONE) tablet 60 mg (60 mg Oral Given 03/15/17 1447)     Initial Impression / Assessment and Plan / ED Course  I have reviewed the triage vital signs and the nursing notes.  Pertinent labs & imaging results that were available during my care of the patient were reviewed by me and considered in my medical decision making (see chart for details).     Patient with several weeks of right knee pain with radiation down the right lower extremity.  Afebrile, vital signs are stable.  She was seen and evaluated by her primary care physician 2 days ago for the same and was also being treated for acute otitis media.  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  Low suspicion of septic joint given  good range of motion and absence of constitutional symptoms or erythema.  Pain has been managed while in the ED and on her days she is feeling much better.  She is ambulatory without difficulty.  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  She has not followed up with an orthopedist. RICE therapy indicated and discussed with patient.  Will discharge with small amount of oxycodone for breakthrough pain as well as a steroid pulse for inflammation.  [place] controlled substance [place] shows she is low risk and has not received narcotic pain medication since July.  Encouraged patient to follow-up with primary care or orthopedics for reevaluation of her symptoms.  Discussed indications for return to the ED. Pt verbalized understanding of and agreement with plan and is safe for discharge home at this time.   Final Clinical Impressions(s) / ED Diagnoses   Final diagnoses:  Right leg pain  Baker's cyst, unruptured, right    ED Discharge Orders        Ordered    predniSONE (DELTASONE) 10 MG tablet  Daily with breakfast     03/15/17 1441    oxyCODONE (ROXICODONE) 5 MG immediate release tablet  Every 6 hours PRN     03/15/17 1441       Jeanie Sewer, PA-C 03/15/17 1609    Cathren Laine, MD 03/15/17 [phone]

## 2017-05-21 DIAGNOSIS — Z01419 Encounter for gynecological examination (general) (routine) without abnormal findings: Secondary | ICD-10-CM | POA: Diagnosis not present

## 2017-05-21 DIAGNOSIS — Z30433 Encounter for removal and reinsertion of intrauterine contraceptive device: Secondary | ICD-10-CM | POA: Diagnosis not present

## 2017-05-21 DIAGNOSIS — N951 Menopausal and female climacteric states: Secondary | ICD-10-CM | POA: Diagnosis not present

## 2017-05-21 DIAGNOSIS — J45909 Unspecified asthma, uncomplicated: Secondary | ICD-10-CM | POA: Diagnosis not present

## 2017-07-02 ENCOUNTER — Emergency Department (HOSPITAL_COMMUNITY)
Admission: EM | Admit: 2017-07-02 | Discharge: 2017-07-02 | Disposition: A | Discharge status: Home / Self Care | Payer: 59 | Attending: Emergency Medicine | Admitting: Emergency Medicine

## 2017-07-02 ENCOUNTER — Encounter (HOSPITAL_COMMUNITY): Payer: Self-pay | Admitting: Emergency Medicine

## 2017-07-02 ENCOUNTER — Other Ambulatory Visit: Payer: Self-pay

## 2017-07-02 DIAGNOSIS — B9789 Other viral agents as the cause of diseases classified elsewhere: Secondary | ICD-10-CM | POA: Insufficient documentation

## 2017-07-02 DIAGNOSIS — R05 Cough: Secondary | ICD-10-CM | POA: Diagnosis not present

## 2017-07-02 DIAGNOSIS — J029 Acute pharyngitis, unspecified: Secondary | ICD-10-CM

## 2017-07-02 DIAGNOSIS — Z87891 Personal history of nicotine dependence: Secondary | ICD-10-CM | POA: Diagnosis not present

## 2017-07-02 DIAGNOSIS — J069 Acute upper respiratory infection, unspecified: Secondary | ICD-10-CM | POA: Diagnosis not present

## 2017-07-02 DIAGNOSIS — R0981 Nasal congestion: Secondary | ICD-10-CM | POA: Diagnosis not present

## 2017-07-02 LAB — RAPID STREP SCREEN (MED CTR MEBANE ONLY): STREPTOCOCCUS, GROUP A SCREEN (DIRECT): NEGATIVE

## 2017-07-02 MED ORDER — FLUTICASONE PROPIONATE 50 MCG/ACT NA SUSP: 2.0000 | Freq: Every day | NASAL | 0 refills | Status: AC

## 2017-07-02 NOTE — ED Triage Notes (Signed)
Pt here from home with c/o uri and sore throat , no fever , dry cough

## 2017-07-02 NOTE — Discharge Instructions (Signed)
Drink plenty of fluids and get plenty of rest.  Gargle warm salt water and spit it out for sore throat. May also use cough drops, warm teas, etc. you may take Chloraseptic spray over-the-counter for relief of sore throat.  Take flonase to decrease nasal congestion.  You may also use nasal saline rinses to help with nasal congestion.  Ask your pharmacist for instructions regarding this.  Over-the-counter allergy medications for nasal congestion and scratchy throat. Alternate 600 mg of ibuprofen and [phone] mg of Tylenol every 3 hours as needed for pain. Do not exceed 4000 mg of Tylenol daily.   Followup with your primary care doctor in 3-5 days for recheck of ongoing symptoms. Return to emergency department for emergent changing or worsening of symptoms such as throat tightness, facial swelling, fever not controlled by ibuprofen or Tylenol,difficulty breathing, or chest pain.

## 2017-07-02 NOTE — ED Provider Notes (Signed)
[facility]   CSN: [id number] Arrival date & time: 07/02/17  [id number]     History   Chief Complaint Chief Complaint  Patient presents with  . Sore Throat    HPI Linda Randolph is a 52 y.o. female presents today for evaluation of nonproductive cough, nasal congestion, ear pressure and sore throat for 3 days.  Her husband has similar symptoms.  She denies shortness of breath or chest pain.  She endorses nasal congestion, worse on the left and associated left ear pressure.  No drainage.  No fevers or chills.  She has been using Mucinex without significant relief of her symptoms.  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  No facial swelling, drooling, or throat tightness.  The history is provided by the patient.    Past Medical History:  Diagnosis Date  . Iron deficiency anemia     There are no active problems to display for this patient.   Past Surgical History:  Procedure Laterality Date  . LYMPHADENECTOMY      OB History    No data available       Home Medications    Prior to Admission medications   Medication Sig Start Date End Date Taking? Authorizing Provider  fluticasone (FLONASE) 50 MCG/ACT nasal spray Place 2 sprays into both nostrils daily. 07/02/17   Michela PitcherFawze, Nema Oatley A, PA-C  levonorgestrel (MIRENA) 20 MCG/24HR IUD 1 each by Intrauterine route once. 09/30/12   [provider]  oxyCODONE (ROXICODONE) 5 MG immediate release tablet Take 1 tablet (5 mg total) by mouth every 6 (six) hours as needed for severe pain. 03/15/17   Jeanie SewerFawze, Vernal Hritz A, PA-C    Family History Family History  Problem Relation Age of Onset  . Hypertension Mother   . Hypertension Father     Social History Social History   Tobacco Use  . Smoking status: Former Smoker    Last attempt to quit: 03/13/2005    Years since quitting: 12.3  . Smokeless tobacco: Never Used  Substance  Use Topics  . Alcohol use: No  . Drug use: No     Allergies   Patient has no known allergies.   Review of Systems Review of Systems  Constitutional: Negative for chills and fever.  HENT: Positive for ear pain, sinus pressure and sore throat. Negative for drooling and facial swelling.   Respiratory: Positive for cough. Negative for shortness of breath.   Cardiovascular: Negative for chest pain.     Physical Exam Updated Vital Signs BP (!) 122/49 (BP Location: Right Arm)   Pulse 78   Temp 97.8 F (36.6 C) (Oral)   Resp 18   SpO2 99%   Physical Exam  Constitutional: She appears well-developed and well-nourished.  Non-toxic appearance. She does not appear ill. No distress.  HENT:  Head: Normocephalic and atraumatic.  Right Ear: Ear canal normal. No drainage, swelling or tenderness. No middle ear effusion.  Left Ear: Ear canal normal. No drainage, swelling or tenderness. A middle ear effusion is present.  Mouth/Throat: Uvula is midline and mucous membranes are normal. No oral lesions. No uvula swelling. Posterior oropharyngeal erythema present. No oropharyngeal exudate, posterior oropharyngeal edema or tonsillar abscesses. Tonsils are 2+ on the right. Tonsils are 2+ on the left. No tonsillar exudate.  Nasal septum midline, mucosal edema bilaterally worse on the left.  There is left maxillary sinus tenderness.  No frontal sinus  tenderness.  Eyes: Conjunctivae and EOM are normal. Pupils are equal, round, and reactive to light. Right eye exhibits no discharge. Left eye exhibits no discharge.  Neck: Normal range of motion. Neck supple. No JVD present. No tracheal deviation present.  Bilateral anterior cervical lymphadenopathy  Cardiovascular: Normal rate, regular rhythm and normal heart sounds.  Pulmonary/Chest: Effort normal and breath sounds normal. No respiratory distress. She has no wheezes. She has no rhonchi. She has no rales.  Abdominal: Soft. Bowel sounds are normal. She  exhibits no distension.  Musculoskeletal: She exhibits no edema.  Lymphadenopathy:    She has cervical adenopathy.  Neurological: She is alert.  Skin: Skin is warm and dry. No erythema.  Psychiatric: She has a normal mood and affect. Her behavior is normal.  Nursing note and vitals reviewed.    ED Treatments / Results  Labs (all labs ordered are listed, but only abnormal results are displayed) Labs Reviewed  RAPID STREP SCREEN (NOT AT [facility])  CULTURE, GROUP A STREP [facility])    EKG  EKG Interpretation None       Radiology No results found.  Procedures Procedures (including critical care time)  Medications Ordered in ED Medications - No data to display   Initial Impression / Assessment and Plan / ED Course  I have reviewed the triage vital signs and the nursing notes.  Pertinent labs & imaging results that were available during my care of the patient were reviewed by me and considered in my medical decision making (see chart for details).     Pt afebrile without tonsillar exudate, negative strep. Presents with mild cervical lymphadenopathy, & dysphagia; diagnosis of viral pharyngitis with viral URI.  She has sinus tenderness to palpation of the left maxillary sinus, suggesting possible viral sinusitis no abx indicated.  Cough is nonproductive, doubt pneumonia.  No meningeal signs or fever to suggest meningitis.  Presentation is not concerning for strep pharyngitis.  Discharge w symptomatic tx for pain and symptoms pt does not appear dehydrated, but did discuss importance of water rehydration. Presentation non concerning for PTA or infxn spread to soft tissue. No trismus or uvula deviation. Specific return precautions discussed. Pt able to drink water in ED without difficulty with intact air way. Recommended PCP follow up. Pt verbalized understanding of and agreement with plan and is safe for discharge home at this time.    Final Clinical Impressions(s) / ED Diagnoses    Final diagnoses:  Viral URI with cough  Viral pharyngitis    ED Discharge Orders        Ordered    fluticasone (FLONASE) 50 MCG/ACT nasal spray  Daily     07/02/17 1153       Jeanie Sewer, PA-C 07/02/17 1154    Arby Barrette, MD 07/06/17 1307

## 2017-07-04 DIAGNOSIS — J019 Acute sinusitis, unspecified: Secondary | ICD-10-CM | POA: Diagnosis not present

## 2017-07-04 DIAGNOSIS — R05 Cough: Secondary | ICD-10-CM | POA: Diagnosis not present

## 2017-07-04 LAB — CULTURE, GROUP A STREP (THRC)

## 2018-03-26 ENCOUNTER — Encounter (HOSPITAL_COMMUNITY): Payer: Self-pay | Admitting: Emergency Medicine

## 2018-03-26 ENCOUNTER — Emergency Department (HOSPITAL_COMMUNITY): Payer: 59

## 2018-03-26 ENCOUNTER — Other Ambulatory Visit: Payer: Self-pay

## 2018-03-26 ENCOUNTER — Emergency Department (HOSPITAL_COMMUNITY)
Admission: EM | Admit: 2018-03-26 | Discharge: 2018-03-26 | Disposition: A | Discharge status: Home / Self Care | Payer: 59 | Attending: Emergency Medicine | Admitting: Emergency Medicine

## 2018-03-26 DIAGNOSIS — M25462 Effusion, left knee: Secondary | ICD-10-CM | POA: Diagnosis not present

## 2018-03-26 DIAGNOSIS — M25562 Pain in left knee: Secondary | ICD-10-CM | POA: Diagnosis not present

## 2018-03-26 DIAGNOSIS — Z79899 Other long term (current) drug therapy: Secondary | ICD-10-CM | POA: Diagnosis not present

## 2018-03-26 DIAGNOSIS — Z87891 Personal history of nicotine dependence: Secondary | ICD-10-CM | POA: Insufficient documentation

## 2018-03-26 MED ORDER — PREDNISONE 20 MG PO TABS: 40.0000 mg | ORAL_TABLET | Freq: Every day | ORAL | 0 refills | Status: DC

## 2018-03-26 NOTE — ED Notes (Signed)
Pt reports she does not want scans done at this time - wants to wait for doctor.

## 2018-03-26 NOTE — ED Triage Notes (Signed)
Pt reports 9/10 left knee pain and swelling that started a week ago and has gotten worse. Pt reports she was on the treadmill recently that she thinks exacerbated the pain. Pt reports taking otc Tylenol & Ibuprofen with some relief. Denies any falls or injuries. Pt is ambulatory with pain to knee.

## 2018-03-26 NOTE — ED Provider Notes (Signed)
[facility]   CSN: [id number] Arrival date & time: 03/26/18  0057     History   Chief Complaint Chief Complaint  Patient presents with  . Knee Pain    HPI Linda Randolph is a 52 y.o. female.  52yo F who p/w L knee pain. 1 week ago, she had a gradual onset of progressively worsening L knee pain w/ some associated swelling. She had started running on treadmill and wonders if this aggravated her knee. She continued to work out on the treadmill this week despite her symptoms. She has taken tylenol and motrin with some relief. No fall or previous injury but she notes that a similar episode happened several months ago. At that time, she received steroids which improved her symptoms. She never followed up with a specialist. No fevers or recent illness.  The history is provided by the patient.  Knee Pain   Pertinent negatives include no numbness.    Past Medical History:  Diagnosis Date  . Iron deficiency anemia     There are no active problems to display for this patient.   Past Surgical History:  Procedure Laterality Date  . LYMPHADENECTOMY       OB History   None      Home Medications    Prior to Admission medications   Medication Sig Start Date End Date Taking? Authorizing Provider  fluticasone (FLONASE) 50 MCG/ACT nasal spray Place 2 sprays into both nostrils daily. 07/02/17   Michela PitcherFawze, Mina A, PA-C  levonorgestrel (MIRENA) 20 MCG/24HR IUD 1 each by Intrauterine route once. 09/30/12   [provider]  oxyCODONE (ROXICODONE) 5 MG immediate release tablet Take 1 tablet (5 mg total) by mouth every 6 (six) hours as needed for severe pain. 03/15/17   Fawze, Mina A, PA-C  predniSONE (DELTASONE) 20 MG tablet Take 2 tablets (40 mg total) by mouth daily. 03/26/18   Little, Ambrose Finlandachel Morgan, MD    Family History Family History  Problem Relation Age of Onset  . Hypertension Mother   . Hypertension Father      Social History Social History   Tobacco Use  . Smoking status: Former Smoker    Last attempt to quit: 03/13/2005    Years since quitting: 13.0  . Smokeless tobacco: Never Used  Substance Use Topics  . Alcohol use: No  . Drug use: No     Allergies   Patient has no known allergies.   Review of Systems Review of Systems  Constitutional: Negative for fever.  Musculoskeletal: Positive for arthralgias and joint swelling.  Skin: Negative for wound.  Neurological: Negative for numbness.     Physical Exam Updated Vital Signs BP (!) 149/104 (BP Location: Left Arm)   Pulse 78   Temp 98 F (36.7 C) (Oral)   Resp 16   Ht 5\' 10"  (1.778 m)   Wt 108.9 kg   SpO2 98%   BMI 34.44 kg/m   Physical Exam  Constitutional: She is oriented to person, place, and time. She appears well-developed and well-nourished. No distress.  HENT:  Head: Normocephalic and atraumatic.  Eyes: Conjunctivae are normal.  Neck: Neck supple.  Cardiovascular: Intact distal pulses.  Musculoskeletal: Normal range of motion. She exhibits no tenderness.  L knee trace effusion with normal ROM, no joint laxity, negative anterior/posterior drawer signs   Neurological: She is alert and oriented to person, place, and time.  Skin: Skin is warm and dry. No erythema.  Psychiatric: She  has a normal mood and affect. Judgment normal.  Nursing note and vitals reviewed.    ED Treatments / Results  Labs (all labs ordered are listed, but only abnormal results are displayed) Labs Reviewed - No data to display  EKG None  Radiology Dg Knee Complete 4 Views Left  Result Date: 03/26/2018 CLINICAL DATA:  52 year old female with left-sided knee swelling and pain. EXAM: LEFT KNEE - COMPLETE 4+ VIEW COMPARISON:  Left knee radiograph dated 08/05/2015 FINDINGS: There is no acute fracture or dislocation. There is mild arthritic changes of the knee. Small to moderate suprapatellar effusion. The soft tissues are  unremarkable. IMPRESSION: 1. No acute fracture or dislocation. 2. Mild arthritic changes and small to moderate suprapatellar effusion. Electronically Signed   By: Elgie Collard M.D.   On: 03/26/2018 06:00    Procedures Procedures (including critical care time)  Medications Ordered in ED Medications - No data to display   Initial Impression / Assessment and Plan / ED Course  I have reviewed the triage vital signs and the nursing notes.  Pertinent imaging results that were available during my care of the patient were reviewed by me and considered in my medical decision making (see chart for details).     No warmth or redness to suggest infection or gout/pseudogout. Xr w/ mild arthritic changes and small suprapatellar effusion.  Discussed supportive measures including elevation, ice, and ortho f/u. Per request provided w/ steroid course as she states it improved symptoms last time. Return precautions reviewed. Final Clinical Impressions(s) / ED Diagnoses   Final diagnoses:  Left knee pain, unspecified chronicity    ED Discharge Orders         Ordered    predniSONE (DELTASONE) 20 MG tablet  Daily     03/26/18 0600           Little, Ambrose Finland, MD 03/26/18 [phone]

## 2018-07-08 ENCOUNTER — Ambulatory Visit: Payer: Self-pay | Admitting: *Deleted

## 2018-07-08 ENCOUNTER — Encounter (HOSPITAL_COMMUNITY): Payer: Self-pay | Admitting: Emergency Medicine

## 2018-07-08 ENCOUNTER — Ambulatory Visit (HOSPITAL_COMMUNITY)
Admission: EM | Admit: 2018-07-08 | Discharge: 2018-07-08 | Disposition: A | Discharge status: Home / Self Care | Payer: 59 | Attending: Family Medicine | Admitting: Family Medicine

## 2018-07-08 ENCOUNTER — Other Ambulatory Visit: Payer: Self-pay

## 2018-07-08 DIAGNOSIS — Z20828 Contact with and (suspected) exposure to other viral communicable diseases: Secondary | ICD-10-CM | POA: Diagnosis not present

## 2018-07-08 DIAGNOSIS — B9789 Other viral agents as the cause of diseases classified elsewhere: Secondary | ICD-10-CM | POA: Diagnosis present

## 2018-07-08 DIAGNOSIS — J069 Acute upper respiratory infection, unspecified: Secondary | ICD-10-CM | POA: Diagnosis present

## 2018-07-08 NOTE — ED Provider Notes (Signed)
[facility]    CSN: [id number] Arrival date & time: 07/08/18  1426     History   Chief Complaint Chief Complaint  Patient presents with  . Letter for School/Work    HPI [name] is a 53 y.o. female no contributing past medical history presenting today for evaluation of cough and possible COVID-19 infection.  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  Last week she has had fever, cough, fatigue and chills.  Her fevers have improved, but does still have a lingering cough.  T-max of 102, but is unsure when she stopped having fevers.  She has approximately a 1.5 weeks out from exposure.  She still has an occasional cough and ear pain and still feels fatigued.  She has been using Tylenol for her symptoms.  Denies any nausea vomiting or diarrhea.  HPI  Past Medical History:  Diagnosis Date  . Iron deficiency anemia     There are no active problems to display for this patient.   Past Surgical History:  Procedure Laterality Date  . LYMPHADENECTOMY      OB History   No obstetric history on file.      Home Medications    Prior to Admission medications   Medication Sig Start Date End Date Taking? Authorizing Provider  fluticasone (FLONASE) 50 MCG/ACT nasal spray Place 2 sprays into both nostrils daily. 07/02/17   Michela PitcherFawze, Mina A, PA-C  levonorgestrel (MIRENA) 20 MCG/24HR IUD 1 each by Intrauterine route once. 09/30/12   [provider]  oxyCODONE (ROXICODONE) 5 MG immediate release tablet Take 1 tablet (5 mg total) by mouth every 6 (six) hours as needed for severe pain. Patient not taking: Reported on 07/08/2018 03/15/17   Michela PitcherFawze, Mina A, PA-C  predniSONE (DELTASONE) 20 MG tablet Take 2 tablets (40 mg total) by mouth daily. Patient not taking: Reported on 07/08/2018 03/26/18   Little, Ambrose Finlandachel Morgan, MD    Family History Family History  Problem Relation Age of Onset   . Hypertension Mother   . Hypertension Father     Social History Social History   Tobacco Use  . Smoking status: Former Smoker    Last attempt to quit: 03/13/2005    Years since quitting: 13.3  . Smokeless tobacco: Never Used  Substance Use Topics  . Alcohol use: No  . Drug use: No     Allergies   Patient has no known allergies.   Review of Systems Review of Systems  Constitutional: Positive for fatigue. Negative for activity change, appetite change, chills and fever.  HENT: Positive for congestion, ear pain and rhinorrhea. Negative for sinus pressure, sore throat and trouble swallowing.   Eyes: Negative for discharge and redness.  Respiratory: Positive for cough. Negative for chest tightness and shortness of breath.   Cardiovascular: Negative for chest pain.  Gastrointestinal: Negative for abdominal pain, diarrhea, nausea and vomiting.  Musculoskeletal: Negative for myalgias.  Skin: Negative for rash.  Neurological: Negative for dizziness, light-headedness and headaches.     Physical Exam Triage Vital Signs ED Triage Vitals  Enc Vitals Group     BP 07/08/18 1446 (!) 146/86     Pulse Rate 07/08/18 1446 79     Resp 07/08/18 1446 18     Temp 07/08/18 1446 98.4 F (36.9 C)     Temp Source 07/08/18 1446 Oral     SpO2 07/08/18 1446 99 %  Weight --      Height --      Head Circumference --      Peak Flow --      Pain Score 07/08/18 1447 6     Pain Loc --      Pain Edu? --      Excl. in GC? --    No data found.  Updated Vital Signs BP (!) 146/86 (BP Location: Right Arm)   Pulse 79   Temp 98.4 F (36.9 C) (Oral)   Resp 18   SpO2 99%   Visual Acuity Right Eye Distance:   Left Eye Distance:   Bilateral Distance:    Right Eye Near:   Left Eye Near:    Bilateral Near:     Physical Exam Vitals signs and nursing note reviewed.  Constitutional:      General: She is not in acute distress.    Appearance: She is well-developed.  HENT:     Head:  Normocephalic and atraumatic.     Ears:     Comments: Bilateral ears without tenderness to palpation of external auricle, tragus and mastoid, EAC's without erythema or swelling, TM's with good bony landmarks and cone of light. Non erythematous.  Vasculature appears injected.    Mouth/Throat:     Comments: Oral mucosa pink and moist, no tonsillar enlargement or exudate. Posterior pharynx patent and nonerythematous, no uvula deviation or swelling. Normal phonation. Eyes:     Conjunctiva/sclera: Conjunctivae normal.  Neck:     Musculoskeletal: Neck supple.  Cardiovascular:     Rate and Rhythm: Normal rate and regular rhythm.     Heart sounds: No murmur.  Pulmonary:     Effort: Pulmonary effort is normal. No respiratory distress.     Breath sounds: Normal breath sounds.     Comments: Breathing comfortably at rest, CTABL, no wheezing, rales or other adventitious sounds auscultated Abdominal:     Palpations: Abdomen is soft.     Tenderness: There is no abdominal tenderness.  Skin:    General: Skin is warm and dry.  Neurological:     Mental Status: She is alert.      UC Treatments / Results  Labs (all labs ordered are listed, but only abnormal results are displayed) Labs Reviewed  NOVEL CORONAVIRUS, NAA (HOSPITAL ORDER, SEND-OUT TO REF LAB)    EKG None  Radiology No results found.  Procedures Procedures (including critical care time)  Medications Ordered in UC Medications - No data to display  Initial Impression / Assessment and Plan / UC Course  I have reviewed the triage vital signs and the nursing notes.  Pertinent labs & imaging results that were available during my care of the patient were reviewed by me and considered in my medical decision making (see chart for details).      Patient with possible COVID-19 exposure/infection. Patient still with symptoms, swab obtained.  Will have patient self quarantine until results obtained.  If positive will have refrain from  work until symptoms fully resolved, if negative patient may return to have work note to return to work.  I recommend symptomatic and supportive care at this time, lungs clear, vital signs stable.  No significant comorbidities.Discussed strict return precautions. Patient verbalized understanding and is agreeable with plan.  Final Clinical Impressions(s) / UC Diagnoses   Final diagnoses:  Viral URI with cough     Discharge Instructions     Please go home to quarantine yourself until results return.  If positive I  will have you stay home until symptoms fully resolve.  If negative you may return to work since you have passed the 14 day period.  Continue Tylenol and Ibuprofen as needed Rest Drink Fluids  Follow up if developing fever, cough, shortness of breath, worsening symptoms   ED Prescriptions    None     Controlled Substance Prescriptions [place] Controlled Substance Registry consulted? Not Applicable   Lew Dawes, [place] 07/08/18 1543

## 2018-07-08 NOTE — Telephone Encounter (Signed)
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 on 06/25/2018; she was notified by her place of employment,; [employer], on 07/07/2018; she says that she has been on self quarantine since 07/07/2018, but her job would like for her to come back to work on 07/08/2018; needs a note for work to return after 07/08/2018;  the pt does complain of bilateral ear pain; she also said that around 07/03/2018 she had a cough but it has since resolved; she is not sure if she had temperature but had been taking dayquil; she denies sore throat or fever at this time; the pt says that she says that she needs a note to return to work after 07/08/2018; she says that she has tried to get help from her PCP, her place of employment, and via e-visit without success; pt given contact numbers for [facility]; pt originally states that is going to be seen at [facility], but she is directed to proceed to [facility] [address]; spoke with Shanda Bumps at [facility] and she states that the pt 14 day post exposure return to work date would be 07/10/2018; she also added that the pt can be seen at that location for her ear pain; pt notified and verbalized understanding; this pt is not seen in the [facility].  Reason for Disposition . [1] COVID-19 EXPOSURE within last 14 days AND [2] mild body aches, chills, diarrhea, headache, runny nose, or sore throat occur  Answer Assessment - Initial Assessment Questions 1. PLACE of CONTACT: "Where were you when you were exposed to COVID-19  (coronavirus disease 2019)?" (e.g., city, state, country)     Flight attendant from [place], [place] to [place]  2. TYPE of CONTACT: "How much contact was there?" (e.g., live in same house, work in same office, same school)     Same flight 3. DATE of CONTACT: "When did you have contact with a coronavirus patient?" (e.g., days)    06/25/2018 4. DURATION of CONTACT: "How long were you in contact with the COVID-19 (coronavirus disease) patient?" (e.g., a few seconds, passed by person, a few minutes, live with the patient)     1 hour while on flight; helped passenger 5. SYMPTOMS: "Do you have any symptoms?" (e.g., fever, cough, breathing difficulty)    Bilateral ear pain, cough starting 07/03/2018 but now resolved, not sure if she had a temperature (was taking nyquil) 6. PREGNANCY OR POSTPARTUM: "Is there any chance you are pregnant?" "When was your last menstrual period?" "Did you deliver in the last 2 weeks?"     No, no 7. HIGH RISK: "Do you have any heart or lung problems? Do you have a weakened immune system?" (e.g., CHF, COPD, asthma, HIV positive, chemotherapy, renal failure, diabetes mellitus, sickle cell anemia)     No heart or lung problems, yes history of asthma, and history of anemia  Protocols used: CORONAVIRUS (COVID-19) EXPOSURE-A-AH

## 2018-07-08 NOTE — Discharge Instructions (Addendum)
Please go home to quarantine yourself until results return.  If positive I will have you stay home until symptoms fully resolve.  If negative you may return to work since you have passed the 14 day period.  Continue Tylenol and Ibuprofen as needed Rest Drink Fluids  Follow up if developing fever, cough, shortness of breath, worsening symptoms

## 2018-07-08 NOTE — ED Triage Notes (Signed)
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

## 2018-07-15 ENCOUNTER — Telehealth (HOSPITAL_COMMUNITY): Payer: Self-pay | Admitting: Emergency Medicine

## 2018-07-15 LAB — NOVEL CORONAVIRUS, NAA (HOSP ORDER, SEND-OUT TO REF LAB; TAT 18-24 HRS): SARS-CoV-2, NAA: NOT DETECTED

## 2018-07-15 NOTE — Telephone Encounter (Signed)
Patient called asking about covid 19 results.  No results in system-remains pending.  Notified patient.  Patient concerned about traveling related to job without knowing results.  Patient did not ask for anything other than results

## 2018-07-15 NOTE — Telephone Encounter (Signed)
Spoke with pt verbalized understanding of results  

## 2018-07-16 ENCOUNTER — Ambulatory Visit (HOSPITAL_COMMUNITY)
Admission: EM | Admit: 2018-07-16 | Discharge: 2018-07-16 | Disposition: A | Discharge status: Home / Self Care | Payer: 59 | Attending: Family Medicine | Admitting: Family Medicine

## 2018-07-16 ENCOUNTER — Encounter (HOSPITAL_COMMUNITY): Payer: Self-pay

## 2018-07-16 ENCOUNTER — Other Ambulatory Visit: Payer: Self-pay

## 2018-07-16 DIAGNOSIS — M26622 Arthralgia of left temporomandibular joint: Secondary | ICD-10-CM | POA: Diagnosis not present

## 2018-07-16 DIAGNOSIS — H6983 Other specified disorders of Eustachian tube, bilateral: Secondary | ICD-10-CM

## 2018-07-16 MED ORDER — PREDNISONE 10 MG (21) PO TBPK: ORAL_TABLET | Freq: Every day | ORAL | 0 refills | Status: DC

## 2018-07-16 NOTE — ED Triage Notes (Signed)
Travel none Fever none Cough none. Pt cc ear pain the left ear is worst then the right. X 2 weeks.

## 2018-07-16 NOTE — ED Notes (Signed)
Patient verbalizes understanding of discharge instructions. Opportunity for questioning and answers were provided. Patient discharged from [facility] by MD. 

## 2018-07-17 NOTE — ED Provider Notes (Signed)
[facility]   [id number] 07/16/18 Arrival Time: 1418  ASSESSMENT & PLAN:  1. TMJ tenderness, left   2. Dysfunction of both eustachian tubes    Trial of: Meds ordered this encounter  Medications   predniSONE (STERAPRED UNI-PAK 21 TAB) 10 MG (21) TBPK tablet    Sig: Take by mouth daily. Take as directed.    Dispense:  21 tablet    Refill:  0   Follow-up Information    Schedule an appointment as soon as possible for a visit  with [facility].   Specialty:  Family Medicine Contact information: [address] [place] [address] [phone]          OTC symptom care as needed. Ensure adequate fluid intake and rest. May f/u with PCP or here as needed.  Reviewed expectations re: course of current medical issues. Questions answered. Outlined signs and symptoms indicating need for more acute intervention. Patient verbalized understanding. After Visit Summary given.   SUBJECTIVE: History from: patient.  Linda Randolph is a 52 y.o. female who presents with complaint of left otalgia described as aching over TMJ; without ear drainage; without ear bleeding. Onset gradual, over the past two weeks. Recent cold symptoms: none. Fever: no. Overall normal PO intake without n/v. Sick contacts: no. Discomfort worse with opening and closing of jaw. Normal hearing. Does not wear hearing aids. OTC treatment: ibuprofen without relief.  Social History   Tobacco Use  Smoking Status Former Smoker   Last attempt to quit: 03/13/2005   Years since quitting: 13.3  Smokeless Tobacco Never Used    ROS: As per HPI. All other systems negative.    OBJECTIVE:  Vitals:   07/16/18 1429 07/16/18 1431  BP:  (!) 137/93  Resp:  18  Temp:  98.3 F (36.8 C)  TempSrc:  Oral  SpO2:  99%  Weight: 90.7 kg      General appearance: alert; appears fatigued Ear Canal: normal TM: bilateral: normal Jaw: very tender over L TMJ with normal jaw movement; no swelling; no  oropharynx lesions or swelling Neck: supple without LAD Lungs: unlabored respirations, symmetrical air entry; cough: absent; no respiratory distress Skin: warm and dry Psychological: alert and cooperative; normal mood and affect  No Known Allergies  Past Medical History:  Diagnosis Date   Iron deficiency anemia    Family History  Problem Relation Age of Onset   Hypertension Mother    Hypertension Father    Social History   Socioeconomic History   Marital status: Significant Other    Spouse name: Not on file   Number of children: Not on file   Years of education: Not on file   Highest education level: Not on file  Occupational History   Not on file  Social Needs   Financial resource strain: Not on file   Food insecurity:    Worry: Not on file    Inability: Not on file   Transportation needs:    Medical: Not on file    Non-medical: Not on file  Tobacco Use   Smoking status: Former Smoker    Last attempt to quit: 03/13/2005    Years since quitting: 13.3   Smokeless tobacco: Never Used  Substance and Sexual Activity   Alcohol use: No   Drug use: No   Sexual activity: Not on file  Lifestyle   Physical activity:    Days per week: Not on file    Minutes per session: Not on file  Stress: Not on file  Relationships   Social connections:    Talks on phone: Not on file    Gets together: Not on file    Attends religious service: Not on file    Active member of club or organization: Not on file    Attends meetings of clubs or organizations: Not on file    Relationship status: Not on file   Intimate partner violence:    Fear of current or ex partner: Not on file    Emotionally abused: Not on file    Physically abused: Not on file    Forced sexual activity: Not on file  Other Topics Concern   Not on file  Social History Narrative   Not on file            Mardella Layman, MD 07/17/18 [phone]

## 2019-09-14 ENCOUNTER — Emergency Department (HOSPITAL_COMMUNITY)
Admission: EM | Admit: 2019-09-14 | Discharge: 2019-09-14 | Disposition: A | Discharge status: Home / Self Care | Payer: 59 | Attending: Emergency Medicine | Admitting: Emergency Medicine

## 2019-09-14 ENCOUNTER — Encounter (HOSPITAL_COMMUNITY): Payer: Self-pay | Admitting: Emergency Medicine

## 2019-09-14 ENCOUNTER — Other Ambulatory Visit: Payer: Self-pay

## 2019-09-14 DIAGNOSIS — T8339XA Other mechanical complication of intrauterine contraceptive device, initial encounter: Secondary | ICD-10-CM | POA: Insufficient documentation

## 2019-09-14 DIAGNOSIS — R21 Rash and other nonspecific skin eruption: Secondary | ICD-10-CM | POA: Insufficient documentation

## 2019-09-14 DIAGNOSIS — Y999 Unspecified external cause status: Secondary | ICD-10-CM | POA: Insufficient documentation

## 2019-09-14 DIAGNOSIS — Y939 Activity, unspecified: Secondary | ICD-10-CM | POA: Insufficient documentation

## 2019-09-14 DIAGNOSIS — Z30432 Encounter for removal of intrauterine contraceptive device: Secondary | ICD-10-CM

## 2019-09-14 DIAGNOSIS — Z87891 Personal history of nicotine dependence: Secondary | ICD-10-CM | POA: Insufficient documentation

## 2019-09-14 DIAGNOSIS — Y929 Unspecified place or not applicable: Secondary | ICD-10-CM | POA: Insufficient documentation

## 2019-09-14 DIAGNOSIS — W57XXXA Bitten or stung by nonvenomous insect and other nonvenomous arthropods, initial encounter: Secondary | ICD-10-CM | POA: Insufficient documentation

## 2019-09-14 LAB — CBC
HCT: 43.4 % (ref 36.0–46.0)
Hemoglobin: 13.9 g/dL (ref 12.0–15.0)
MCH: 29.9 pg (ref 26.0–34.0)
MCHC: 32 g/dL (ref 30.0–36.0)
MCV: 93.3 fL (ref 80.0–100.0)
Platelets: 249 10*3/uL (ref 150–400)
RBC: 4.65 MIL/uL (ref 3.87–5.11)
RDW: 13.4 % (ref 11.5–15.5)
WBC: 8.7 10*3/uL (ref 4.0–10.5)
nRBC: 0 % (ref 0.0–0.2)

## 2019-09-14 LAB — URINALYSIS, ROUTINE W REFLEX MICROSCOPIC
Bacteria, UA: NONE SEEN
Bilirubin Urine: NEGATIVE
Glucose, UA: NEGATIVE mg/dL
Hgb urine dipstick: NEGATIVE
Ketones, ur: NEGATIVE mg/dL
Leukocytes,Ua: NEGATIVE
Nitrite: NEGATIVE
Protein, ur: 100 mg/dL — AB
Specific Gravity, Urine: 1.018 (ref 1.005–1.030)
pH: 9 — ABNORMAL HIGH (ref 5.0–8.0)

## 2019-09-14 LAB — COMPREHENSIVE METABOLIC PANEL
ALT: 20 U/L (ref 0–44)
AST: 20 U/L (ref 15–41)
Albumin: 3.8 g/dL (ref 3.5–5.0)
Alkaline Phosphatase: 69 U/L (ref 38–126)
Anion gap: 12 (ref 5–15)
BUN: 6 mg/dL (ref 6–20)
CO2: 29 mmol/L (ref 22–32)
Calcium: 9.3 mg/dL (ref 8.9–10.3)
Chloride: 101 mmol/L (ref 98–111)
Creatinine, Ser: 0.84 mg/dL (ref 0.44–1.00)
GFR calc Af Amer: >60 mL/min (ref >60)
GFR calc non Af Amer: >60 mL/min (ref >60)
Glucose, Bld: 88 mg/dL (ref 70–99)
Potassium: 3.5 mmol/L (ref 3.5–5.1)
Sodium: 142 mmol/L (ref 135–145)
Total Bilirubin: 0.8 mg/dL (ref 0.3–1.2)
Total Protein: 7.4 g/dL (ref 6.5–8.1)

## 2019-09-14 LAB — LIPASE, BLOOD: Lipase: 24 U/L (ref 11–51)

## 2019-09-14 MED ORDER — SODIUM CHLORIDE 0.9% FLUSH: 3.0000 mL | Freq: Once | INTRAVENOUS | Status: DC

## 2019-09-14 MED ORDER — HYDROXYZINE HCL 25 MG PO TABS: 25.0000 mg | ORAL_TABLET | Freq: Three times a day (TID) | ORAL | 0 refills | Status: DC | PRN

## 2019-09-14 MED ORDER — HYDROCORTISONE 1 % EX LOTN: 1.0000 "application " | TOPICAL_LOTION | Freq: Two times a day (BID) | CUTANEOUS | 0 refills | Status: AC

## 2019-09-14 MED ORDER — HYDROCORTISONE 1 % EX LOTN: 1.0000 "application " | TOPICAL_LOTION | Freq: Two times a day (BID) | CUTANEOUS | 0 refills | Status: DC

## 2019-09-14 NOTE — ED Triage Notes (Signed)
C/o RLQ pain x 2 weeks that she relates to her IUD.  Denies nausea, vomiting, and diarrhea.  Also reports lesions on arms that are itchy x 3 weeks.

## 2019-09-15 NOTE — ED Provider Notes (Signed)
[facility] EMERGENCY DEPARTMENT Provider Note   CSN: [id number] Arrival date & time: 09/14/19  1221     History Chief Complaint  Patient presents with  . Abdominal Pain  . Rash    Linda Randolph is a 54 y.o. female.  Pt reports she has an IUD that has been in over 3 years.  She was told that she is suppose to have it removed.  Pt thinks it is causing her discomfort.  Pt also has a rash and itching.  Pt had IUD for heavy menopausal bleeding.  Pt reports she is post menopausal now.   The history is provided by the patient. No language interpreter was used.  Abdominal Pain Pain location:  Suprapubic Pain radiates to:  Does not radiate Pain severity:  Mild Onset quality:  Gradual Progression:  Worsening Chronicity:  New Relieved by:  Nothing Worsened by:  Nothing Rash Associated symptoms: abdominal pain        Past Medical History:  Diagnosis Date  . Iron deficiency anemia     There are no problems to display for this patient.   Past Surgical History:  Procedure Laterality Date  . LYMPHADENECTOMY       OB History   No obstetric history on file.     Family History  Problem Relation Age of Onset  . Hypertension Mother   . Hypertension Father     Social History   Tobacco Use  . Smoking status: Former Smoker    Quit date: 03/13/2005    Years since quitting: 14.5  . Smokeless tobacco: Never Used  Substance Use Topics  . Alcohol use: No  . Drug use: No    Home Medications Prior to Admission medications   Medication Sig Start Date End Date Taking? Authorizing Provider  fluticasone (FLONASE) 50 MCG/ACT nasal spray Place 2 sprays into both nostrils daily. 07/02/17   Fawze, Mina A, PA-C  hydrocortisone 1 % lotion Apply 1 application topically 2 (two) times daily. 09/14/19   Elson Areas, PA-C  hydrOXYzine (ATARAX/VISTARIL) 25 MG tablet Take 1 tablet (25 mg total) by mouth 3 (three) times daily as needed. 09/14/19   Elson Areas,  PA-C  levonorgestrel (MIRENA) 20 MCG/24HR IUD 1 each by Intrauterine route once. 09/30/12   [provider]  oxyCODONE (ROXICODONE) 5 MG immediate release tablet Take 1 tablet (5 mg total) by mouth every 6 (six) hours as needed for severe pain. Patient not taking: Reported on 07/08/2018 03/15/17   Michela Pitcher A, PA-C  predniSONE (STERAPRED UNI-PAK 21 TAB) 10 MG (21) TBPK tablet Take by mouth daily. Take as directed. 07/16/18   Mardella Layman, MD    Allergies    Patient has no known allergies.  Review of Systems   Review of Systems  Gastrointestinal: Positive for abdominal pain.  Skin: Positive for rash.  All other systems reviewed and are negative.   Physical Exam Updated Vital Signs BP 137/82   Pulse (!) 102   Temp 98.7 F (37.1 C) (Oral)   Resp 18   Ht 5\' 10"  (1.778 m)   Wt 91 kg   SpO2 98%   BMI 28.79 kg/m   Physical Exam Vitals and nursing note reviewed.  Constitutional:      Appearance: She is well-developed.  HENT:     Head: Normocephalic.  Cardiovascular:     Rate and Rhythm: Normal rate.  Pulmonary:     Effort: Pulmonary effort is normal.  Abdominal:  General: Abdomen is flat. Bowel sounds are normal. There is no distension.     Palpations: Abdomen is soft.     Tenderness: There is no abdominal tenderness.  Genitourinary:    Vagina: Normal.     Cervix: Normal.     Uterus: Normal.      Adnexa: Right adnexa normal and left adnexa normal.  Musculoskeletal:        General: Normal range of motion.     Cervical back: Normal range of motion.  Skin:    General: Skin is warm.     Comments: Scattered raised areas, looks like bites   Neurological:     General: No focal deficit present.     Mental Status: She is alert and oriented to person, place, and time.  Psychiatric:        Mood and Affect: Mood normal.    Procedure, IUD removed with ring forcep, no bleeding, no pain with removal  ED Results / Procedures / Treatments   Labs (all labs ordered  are listed, but only abnormal results are displayed) Labs Reviewed  URINALYSIS, ROUTINE W REFLEX MICROSCOPIC - Abnormal; Notable for the following components:      Result Value   pH 9.0 (*)    Protein, ur 100 (*)    All other components within normal limits  LIPASE, BLOOD  COMPREHENSIVE METABOLIC PANEL  CBC    EKG None  Radiology No results found.  Procedures Procedures (including critical care time)  Medications Ordered in ED Medications - No data to display  ED Course  I have reviewed the triage vital signs and the nursing notes.  Pertinent labs & imaging results that were available during my care of the patient were reviewed by me and considered in my medical decision making (see chart for details).    MDM Rules/Calculators/A&P                      MDM:  Pt advised to follow up with gyn for yearly exam.  Rx for hydrocortisone  And atarax for bites  Final Clinical Impression(s) / ED Diagnoses Final diagnoses:  Encounter for IUD removal  Insect bite, unspecified site, initial encounter    Rx / DC Orders ED Discharge Orders         Ordered    hydrOXYzine (ATARAX/VISTARIL) 25 MG tablet  3 times daily PRN,   Status:  Discontinued     09/14/19 1445    hydrocortisone 1 % lotion  2 times daily,   Status:  Discontinued     09/14/19 1445    hydrocortisone 1 % lotion  2 times daily     09/14/19 1507    hydrOXYzine (ATARAX/VISTARIL) 25 MG tablet  3 times daily PRN     09/14/19 1507        An After Visit Summary was printed and given to the patient.    Fransico Meadow, [place] 09/15/19 [id number]    Veryl Speak, MD 09/15/19 [phone]

## 2019-10-07 ENCOUNTER — Encounter (HOSPITAL_COMMUNITY): Payer: Self-pay | Admitting: Emergency Medicine

## 2019-10-07 ENCOUNTER — Emergency Department (HOSPITAL_COMMUNITY)
Admission: EM | Admit: 2019-10-07 | Discharge: 2019-10-08 | Disposition: A | Discharge status: Home / Self Care | Payer: Self-pay | Attending: Emergency Medicine | Admitting: Emergency Medicine

## 2019-10-07 ENCOUNTER — Other Ambulatory Visit: Payer: Self-pay

## 2019-10-07 DIAGNOSIS — L03114 Cellulitis of left upper limb: Secondary | ICD-10-CM | POA: Insufficient documentation

## 2019-10-07 DIAGNOSIS — Z79899 Other long term (current) drug therapy: Secondary | ICD-10-CM | POA: Insufficient documentation

## 2019-10-07 DIAGNOSIS — Z87891 Personal history of nicotine dependence: Secondary | ICD-10-CM | POA: Insufficient documentation

## 2019-10-07 DIAGNOSIS — L509 Urticaria, unspecified: Secondary | ICD-10-CM | POA: Insufficient documentation

## 2019-10-07 LAB — CBC WITH DIFFERENTIAL/PLATELET
Abs Immature Granulocytes: 0.03 10*3/uL (ref 0.00–0.07)
Basophils Absolute: 0 10*3/uL (ref 0.0–0.1)
Basophils Relative: 0 %
Eosinophils Absolute: 0.4 10*3/uL (ref 0.0–0.5)
Eosinophils Relative: 4 %
HCT: 44.2 % (ref 36.0–46.0)
Hemoglobin: 14.2 g/dL (ref 12.0–15.0)
Immature Granulocytes: 0 %
Lymphocytes Relative: 26 %
Lymphs Abs: 2.3 10*3/uL (ref 0.7–4.0)
MCH: 29.8 pg (ref 26.0–34.0)
MCHC: 32.1 g/dL (ref 30.0–36.0)
MCV: 92.9 fL (ref 80.0–100.0)
Monocytes Absolute: 0.4 10*3/uL (ref 0.1–1.0)
Monocytes Relative: 5 %
Neutro Abs: 5.8 10*3/uL (ref 1.7–7.7)
Neutrophils Relative %: 65 %
Platelets: 282 10*3/uL (ref 150–400)
RBC: 4.76 MIL/uL (ref 3.87–5.11)
RDW: 13.2 % (ref 11.5–15.5)
WBC: 8.9 10*3/uL (ref 4.0–10.5)
nRBC: 0 % (ref 0.0–0.2)

## 2019-10-07 LAB — URINALYSIS, ROUTINE W REFLEX MICROSCOPIC
Bacteria, UA: NONE SEEN
Bilirubin Urine: NEGATIVE
Glucose, UA: NEGATIVE mg/dL
Hgb urine dipstick: NEGATIVE
Ketones, ur: NEGATIVE mg/dL
Nitrite: NEGATIVE
Protein, ur: 30 mg/dL — AB
Specific Gravity, Urine: 1.025 (ref 1.005–1.030)
pH: 7 (ref 5.0–8.0)

## 2019-10-07 LAB — BASIC METABOLIC PANEL
Anion gap: 10 (ref 5–15)
BUN: 6 mg/dL (ref 6–20)
CO2: 29 mmol/L (ref 22–32)
Calcium: 9.1 mg/dL (ref 8.9–10.3)
Chloride: 103 mmol/L (ref 98–111)
Creatinine, Ser: 0.82 mg/dL (ref 0.44–1.00)
GFR calc Af Amer: >60 mL/min (ref >60)
GFR calc non Af Amer: >60 mL/min (ref >60)
Glucose, Bld: 96 mg/dL (ref 70–99)
Potassium: 4.2 mmol/L (ref 3.5–5.1)
Sodium: 142 mmol/L (ref 135–145)

## 2019-10-07 LAB — I-STAT BETA HCG BLOOD, ED (MC, WL, AP ONLY): I-stat hCG, quantitative: 6 m[IU]/mL — ABNORMAL HIGH (ref <5)

## 2019-10-07 NOTE — ED Triage Notes (Signed)
Patient reports persistent generalized itchy hives/rashes for 1 month unrelieved by prescription/OTC medications , respirations unlabored , no oral swelling/airway intact. Denies fever or chills .

## 2019-10-08 MED ORDER — AQUAPHOR EX OINT: TOPICAL_OINTMENT | CUTANEOUS | 0 refills | Status: AC | PRN

## 2019-10-08 MED ORDER — HYDROXYZINE HCL 25 MG PO TABS: 25.0000 mg | ORAL_TABLET | Freq: Four times a day (QID) | ORAL | 0 refills | Status: AC

## 2019-10-08 MED ORDER — PREDNISONE 10 MG PO TABS: 20.0000 mg | ORAL_TABLET | Freq: Every day | ORAL | 0 refills | Status: AC

## 2019-10-08 MED ORDER — CETIRIZINE HCL 10 MG PO TABS: 10.0000 mg | ORAL_TABLET | Freq: Every day | ORAL | 0 refills | Status: DC

## 2019-10-08 MED ORDER — PREDNISONE 20 MG PO TABS: 20.0000 mg | ORAL_TABLET | Freq: Once | ORAL | Status: DC

## 2019-10-08 MED ORDER — DOXYCYCLINE HYCLATE 100 MG PO CAPS: 100.0000 mg | ORAL_CAPSULE | Freq: Two times a day (BID) | ORAL | 0 refills | Status: AC

## 2019-10-08 NOTE — ED Provider Notes (Signed)
[facility] EMERGENCY DEPARTMENT Provider Note   CSN: [id number] Arrival date & time: 10/07/19  1922    History Chief Complaint  Patient presents with   Urticaria    Linda Randolph is a 54 y.o. female with medical history significant for anemia who presents for evaluation of urticaria.  Patient with intermittent urticaria x1 month.  Was seen here in the ED and thought to have possible insect bites.  Was prescribed Atarax and hydrocortisone cream.  Patient states initially this helped however she continues to have symptoms.  She has diffuse urticaria to bilateral upper and lower extremities as well as anterior and posterior trunk.  She has.  Excoriations from itching.  States she did recently switch soaps.  No known allergies that she knows of.  No headache, lightheadedness, dizziness, sensation of throat closing, oral swelling, chest pain, shortness breath abdominal pain, diarrhea, dysuria.  Denies additional aggravating or alleviating factors.  Has been using Benadryl every 8 hours at home without relief.  History obtained from patient and past medical records.  No interpreter is used.  HPI     Past Medical History:  Diagnosis Date   Iron deficiency anemia     There are no problems to display for this patient.   Past Surgical History:  Procedure Laterality Date   LYMPHADENECTOMY       OB History   No obstetric history on file.     Family History  Problem Relation Age of Onset   Hypertension Mother    Hypertension Father     Social History   Tobacco Use   Smoking status: Former Smoker    Quit date: 03/13/2005    Years since quitting: 14.5   Smokeless tobacco: Never Used  Vaping Use   Vaping Use: Never used  Substance Use Topics   Alcohol use: No   Drug use: No    Home Medications Prior to Admission medications   Medication Sig Start Date End Date Taking? Authorizing Provider  cetirizine (ZYRTEC ALLERGY) 10 MG tablet Take 1  tablet (10 mg total) by mouth daily. 10/08/19   Mattheu Brodersen A, PA-C  doxycycline (VIBRAMYCIN) 100 MG capsule Take 1 capsule (100 mg total) by mouth 2 (two) times daily. 10/08/19   Nyilah Kight A, PA-C  fluticasone (FLONASE) 50 MCG/ACT nasal spray Place 2 sprays into both nostrils daily. 07/02/17   Fawze, Mina A, PA-C  hydrocortisone 1 % lotion Apply 1 application topically 2 (two) times daily. 09/14/19   Elson Areas, PA-C  hydrOXYzine (ATARAX/VISTARIL) 25 MG tablet Take 1 tablet (25 mg total) by mouth every 6 (six) hours. 10/08/19   [place] Sigmund A, PA-C  levonorgestrel (MIRENA) 20 MCG/24HR IUD 1 each by Intrauterine route once. 09/30/12   [provider]  mineral oil-hydrophilic petrolatum (AQUAPHOR) ointment Apply topically as needed for dry skin. 10/08/19   Estera Ozier A, PA-C  oxyCODONE (ROXICODONE) 5 MG immediate release tablet Take 1 tablet (5 mg total) by mouth every 6 (six) hours as needed for severe pain. Patient not taking: Reported on 07/08/2018 03/15/17   Michela Pitcher A, PA-C  predniSONE (DELTASONE) 10 MG tablet Take 2 tablets (20 mg total) by mouth daily for 5 days. 10/08/19 10/13/19  Shaunte Weissinger A, PA-C    Allergies    Patient has no known allergies.  Review of Systems   Review of Systems  Constitutional: Negative.   HENT: Negative.   Respiratory: Negative.   Cardiovascular: Negative.   Gastrointestinal: Negative.  Genitourinary: Negative.   Musculoskeletal: Negative.   Skin: Positive for rash.  Neurological: Negative.   All other systems reviewed and are negative.   Physical Exam Updated Vital Signs BP (!) 142/113 (BP Location: Right Arm)    Pulse 98    Temp 98.9 F (37.2 C) (Oral)    Resp 16    Ht 5\' 9"  (1.753 m)    Wt 125 kg    LMP 04/08/2015    SpO2 100%    BMI 40.70 kg/m   Physical Exam Vitals and nursing note reviewed.  Constitutional:      General: She is not in acute distress.    Appearance: She is well-developed. She is not  ill-appearing, toxic-appearing or diaphoretic.  HENT:     Head: Normocephalic and atraumatic.     Jaw: There is normal jaw occlusion.     Comments: No facial or intraoral edema, lesions.  No drooling, dysphagia or trismus.    Nose: Nose normal.     Mouth/Throat:     Lips: Pink.     Mouth: Mucous membranes are moist.     Pharynx: Oropharynx is clear. Uvula midline.     Comments: Posterior oropharynx clear. Eyes:     Pupils: Pupils are equal, round, and reactive to light.  Cardiovascular:     Rate and Rhythm: Normal rate.     Pulses: Normal pulses.     Heart sounds: Normal heart sounds.  Pulmonary:     Effort: Pulmonary effort is normal. No respiratory distress.     Breath sounds: Normal breath sounds.  Abdominal:     General: Bowel sounds are normal. There is no distension.     Tenderness: There is no abdominal tenderness. There is no right CVA tenderness, left CVA tenderness or guarding.  Musculoskeletal:        General: Normal range of motion.     Cervical back: Normal range of motion.     Comments: Moves all 4 extremities without difficulty.  Compartments soft.  No bony tenderness.  Tactile temperature to extremities.  Skin:    General: Skin is warm and dry.     Capillary Refill: Capillary refill takes less than 2 seconds.     Comments: Diffuse urticaria to bilateral upper lower extremities as well as anterior posterior trunk.  No mucous membrane involvement.  No rash on palms or soles.  Does have some areas of excoriation from pruritus.  She has 1 cm area of induration without fluctuance to her left anterior upper arm.  Some surrounding erythema.  Consistent with early abscess.  No area of fluctuance for drainage.  Neurological:     General: No focal deficit present.     Mental Status: She is alert.     Cranial Nerves: Cranial nerves are intact.     Sensory: Sensation is intact.     Motor: Motor function is intact.     Coordination: Coordination is intact.     Gait: Gait is  intact.    ED Results / Procedures / Treatments   Labs (all labs ordered are listed, but only abnormal results are displayed) Labs Reviewed  URINALYSIS, ROUTINE W REFLEX MICROSCOPIC - Abnormal; Notable for the following components:      Result Value   Protein, ur 30 (*)    Leukocytes,Ua TRACE (*)    All other components within normal limits  I-STAT BETA HCG BLOOD, ED ([facility], [facility], [facility] ONLY) - Abnormal; Notable for the following components:   I-stat hCG, quantitative  6.0 (*)    All other components within normal limits  CBC WITH DIFFERENTIAL/PLATELET  BASIC METABOLIC PANEL    EKG None  Radiology No results found.  Procedures Procedures (including critical care time)  Medications Ordered in ED Medications - No data to display  ED Course  I have reviewed the triage vital signs and the nursing notes.  Pertinent labs & imaging results that were available during my care of the patient were reviewed by me and considered in my medical decision making (see chart for details).  54 year old female presents for evaluation of intermittent urticaria x1 month.  Was seen here 1 month ago for similar.  Started on Atarax and hydrocortisone cream.  Patient with continued symptoms.  She did recently start a new cleaner however she has discontinued this.  No known prior allergies.  No evidence of anaphylaxis.  She has diffuse urticaria to bilateral upper lower extremities as well as anterior and posterior trunk.  Posterior oropharynx is clear.  No emesis, abdominal pain or paresthesias.  She has varying areas of excoriations from pruritus.  She does have small area of what looks to be early developing abscess with some cellulitis to her left anterior arm.  There is some induration without fluctuance, approximately 1 cm.  We will plan on treating for urticaria as well as antibiotics at this abscess with some surrounding cellulitis.  Patient does not appear systemically ill.  She is hemodynamically  stable.  Patient re-evaluated prior to dc, is hemodynamically stable, in no respiratory distress, and denies the feeling of throat closing. Pt has been advised to return to the ED if they have a mod-severe allergic rxn (s/s including throat closing, difficulty breathing, swelling of lips face or tongue).   Pt has a patent airway without stridor and is handling secretions without difficulty; no angioedema. No blisters, no pustules, no warmth, no draining sinus tracts, no bullous impetigo, no vesicles, no desquamation, no target lesions with dusky purpura or a central bulla. Not tender to touch. No concern for SJS, TEN, TSS, tick borne illness, syphilis or other life-threatening condition. Will discharge home with short course of steroids, antihistamine. Abx for cellulitis.  The patient has been appropriately medically screened and/or stabilized in the ED. I have low suspicion for any other emergent medical condition which would require further screening, evaluation or treatment in the ED or require inpatient management.  Patient is hemodynamically stable and in no acute distress.  Patient able to ambulate in department prior to ED.  Evaluation does not show acute pathology that would require ongoing or additional emergent interventions while in the emergency department or further inpatient treatment.  I have discussed the diagnosis with the patient and answered all questions.  Pain is been managed while in the emergency department and patient has no further complaints prior to discharge.  Patient is comfortable with plan discussed in room and is stable for discharge at this time.  I have discussed strict return precautions for returning to the emergency department.  Patient was encouraged to follow-up with PCP/specialist refer to at discharge.   MDM Rules/Calculators/A&P                           Final Clinical Impression(s) / ED Diagnoses Final diagnoses:  Urticaria  Cellulitis of left upper  extremity    Rx / DC Orders ED Discharge Orders         Ordered    predniSONE (DELTASONE) 10 MG  tablet  Daily     Discontinue  Reprint     10/08/19 0340    cetirizine (ZYRTEC ALLERGY) 10 MG tablet  Daily     Discontinue  Reprint     10/08/19 0340    doxycycline (VIBRAMYCIN) 100 MG capsule  2 times daily     Discontinue  Reprint     10/08/19 0340    mineral oil-hydrophilic petrolatum (AQUAPHOR) ointment  As needed     Discontinue  Reprint     10/08/19 0340    hydrOXYzine (ATARAX/VISTARIL) 25 MG tablet  Every 6 hours     Discontinue  Reprint     10/08/19 0340    Ambulatory referral to Allergy     Discontinue  Reprint     10/08/19 0341           Kieran Nachtigal A, PA-C 10/08/19 0341    Zadie Rhine, MD 10/08/19 [phone]

## 2019-10-08 NOTE — ED Notes (Signed)
Patient verbalizes understanding of discharge instructions. Opportunity for questioning and answers were provided. Armband removed by staff, pt discharged from ED stable & ambulatory  

## 2019-10-08 NOTE — Discharge Instructions (Signed)
Take the medications as prescribed  Return for new or worsening symptoms 

## 2019-11-11 ENCOUNTER — Encounter: Payer: Self-pay | Admitting: Allergy & Immunology

## 2019-11-11 ENCOUNTER — Other Ambulatory Visit: Payer: Self-pay

## 2019-11-11 ENCOUNTER — Ambulatory Visit: Payer: Self-pay | Admitting: Allergy & Immunology

## 2019-11-11 VITALS — BP 100/80 | HR 114 | Temp 97.4°F | Resp 18 | Ht 67.5 in | Wt 236.4 lb

## 2019-11-11 DIAGNOSIS — L508 Other urticaria: Secondary | ICD-10-CM

## 2019-11-11 MED ORDER — FAMOTIDINE 20 MG PO TABS: 20.0000 mg | ORAL_TABLET | Freq: Every day | ORAL | 5 refills | Status: DC

## 2019-11-11 MED ORDER — MONTELUKAST SODIUM 10 MG PO TABS: 10.0000 mg | ORAL_TABLET | Freq: Every day | ORAL | 5 refills | Status: DC

## 2019-11-11 MED ORDER — CETIRIZINE HCL 10 MG PO TABS: ORAL_TABLET | ORAL | 3 refills | Status: AC

## 2019-11-11 NOTE — Progress Notes (Signed)
NEW PATIENT  Date of Service/Encounter:  11/12/19  Referring provider: [facility]   Assessment:   Chronic urticaria - without any atopic history and no "red flags"  Plan/Recommendations:   1. Chronic urticaria - Your history does not have any "red flags" such as fevers, joint pains, or permanent skin changes that would be concerning for a more serious cause of hives.  - Chronic hives are often times a self limited process and will "burn themselves out" over 6-12 months, although this is not always the case.  - Start the prednisone pack provided.  - In the meantime, start suppressive dosing of antihistamines:   - Morning: Zyrtec (cetirizine) 10-20mg  (one or two tablets) + Pepcid (famotidine) 20mg   - Evening: Zyrtec (cetirizine) 10-20mg  (one or two tablets) + Pepcid (famotidine) 20mg  + Singulair (montelukast) 10mg  - You can change this dosing at home, decreasing the dose as needed or increasing the dosing as needed.  - I do not think that we need to do a large workup at this time since you are uninsured right now.  - We will see how you do on the antihistamine regimen for now.   2. Return in about 3 months (around 02/11/2020). This can be an in-person, a virtual Webex or a telephone follow up visit.  Subjective:   Linda Randolph is a 54 y.o. female presenting today for evaluation of  Chief Complaint  Patient presents with  . Urticaria    all over     Linda Randolph has a history of the following: Patient Active Problem List   Diagnosis Date Noted  . Chronic urticaria 11/12/2019    History obtained from: chart review and patient.  Linda Randolph was referred by [facility].     Linda Randolph is a 54 y.o. female presenting for an evaluation of urticaria.  She reports that she has been having hives for a few weeks now. She does report that she is living with someone and she very depressed. She lost her job during the pandemic (she was a Linda Randolph). She has been drinking more coffee and she has been smoking more. She has had the hives for two months. She has tried treating it with Benadryl (but only the pills, not the liquid), Advil, and Zyrtec (she has only taken one tablet daily). She has also used prednisone from other providers and she has been on montelukast (white diamond pills). She has bene gaining a lot of weight and walking a lot to try to lose it. She has been sweating and having hives pop up when she sweats. She reports that she is having hot flashes from going through menopause. She has never been on Pepcid.   She did have asthma growing up but otherwise no atopic history. She thinks it is related to nerves. She denies allergic rhinitis symptoms. She was eating a lot of red meat but she has been trying to "wean off" of that. She has been eating more chicken. She has not had any tick bites to her knowledge.   Otherwise, there is no history of other atopic diseases, including asthma, food allergies, drug allergies, environmental allergies, stinging insect allergies or contact dermatitis. There is no significant infectious history. Vaccinations are up to date.    Past Medical History: Patient Active Problem List   Diagnosis Date Noted  . Chronic urticaria 11/12/2019    Medication List:  Allergies as of 11/11/2019   No Known Allergies  Medication List       Accurate as of November 11, 2019 11:59 PM. If you have any questions, ask your nurse or doctor.        cetirizine 10 MG tablet Commonly known as: ZyrTEC Allergy 1 or 2 tablets daily What changed:   how much to take  how to take this  when to take this  additional instructions Changed by: Alfonse Spruce, MD   doxycycline 100 MG capsule Commonly known as: VIBRAMYCIN Take 1 capsule (100 mg total) by mouth 2 (two) times daily.   famotidine 20 MG tablet Commonly known as: PEPCID Take 1 tablet (20 mg total) by mouth daily. Started by: Alfonse Spruce, MD   fluticasone 50 MCG/ACT nasal spray Commonly known as: FLONASE Place 2 sprays into both nostrils daily.   hydrocortisone 1 % lotion Apply 1 application topically 2 (two) times daily.   hydrOXYzine 25 MG tablet Commonly known as: ATARAX/VISTARIL Take 1 tablet (25 mg total) by mouth every 6 (six) hours.   levonorgestrel 20 MCG/24HR IUD Commonly known as: MIRENA 1 each by Intrauterine route once.   mineral oil-hydrophilic petrolatum ointment Apply topically as needed for dry skin.   montelukast 10 MG tablet Commonly known as: Singulair Take 1 tablet (10 mg total) by mouth at bedtime. Started by: Alfonse Spruce, MD   oxyCODONE 5 MG immediate release tablet Commonly known as: Roxicodone Take 1 tablet (5 mg total) by mouth every 6 (six) hours as needed for severe pain.       Birth History: non-contributory  Developmental History: non-contributory  Past Surgical History: Past Surgical History:  Procedure Laterality Date  . LYMPHADENECTOMY       Family History: Family History  Problem Relation Age of Onset  . Hypertension Mother   . Hypertension Father      Social History: Linda Randolph lives at home with her boyfriend, with whom she has a strained relationship. They live in a mobile home that is 38.55 years old. There is carpeting and wood in the main living areas and carpeting in the bedroom. There are no animals inside or outside of the home. There are dust mite coverings on the bedding, but not the pillows. There is no tobacco exposure in the home. She is not exposed to chemicals, fumes, or dust. She [place] not [place] interstate or industrial area.     Review of Systems  Constitutional: Negative.  Negative for chills, fever, malaise/fatigue and weight loss.  HENT: Negative for congestion, ear discharge, ear pain, sinus pain and sore throat.   Eyes: Negative for pain, discharge and redness.  Respiratory: Negative for cough, sputum production,  shortness of breath and wheezing.   Cardiovascular: Negative.  Negative for chest pain and palpitations.  Gastrointestinal: Negative for abdominal pain, constipation, diarrhea, heartburn, nausea and vomiting.  Skin: Positive for itching and rash.  Neurological: Negative for dizziness and headaches.  Endo/Heme/Allergies: Positive for environmental allergies. Does not bruise/bleed easily.  Psychiatric/Behavioral: Positive for depression. Negative for suicidal ideas. The patient is nervous/anxious.        Objective:   Blood pressure 100/80, pulse (!) 114, temperature (!) 97.4 F (36.3 C), temperature source Temporal, resp. rate 18, height 5' 7.5" (1.715 m), weight 236 lb 6.4 oz (107.2 kg), last menstrual period 04/08/2015, SpO2 97 %. Body mass index is 36.48 kg/m.   Physical Exam:   Physical Exam Constitutional:      Appearance: She is well-developed.     Comments: Pleasant  talkative female. Cooperative with the exam. Tearful at times.   HENT:     Head: Normocephalic and atraumatic.     Right Ear: Tympanic membrane, ear canal and external ear normal. No drainage, swelling or tenderness. Tympanic membrane is not injected, scarred, erythematous, retracted or bulging.     Left Ear: Tympanic membrane, ear canal and external ear normal. No drainage, swelling or tenderness. Tympanic membrane is not injected, scarred, erythematous, retracted or bulging.     Nose: No nasal deformity, septal deviation, mucosal edema or rhinorrhea.     Right Turbinates: Not enlarged, swollen or pale.     Left Turbinates: Not enlarged, swollen or pale.     Right Sinus: No maxillary sinus tenderness or frontal sinus tenderness.     Left Sinus: No maxillary sinus tenderness or frontal sinus tenderness.     Mouth/Throat:     Mouth: Mucous membranes are not pale and not dry.     Pharynx: Uvula midline.  Eyes:     General:        Right eye: No discharge.        Left eye: No discharge.     Conjunctiva/sclera:  Conjunctivae normal.     Right eye: Right conjunctiva is not injected. No chemosis.    Left eye: Left conjunctiva is not injected. No chemosis.    Pupils: Pupils are equal, round, and reactive to light.  Cardiovascular:     Rate and Rhythm: Normal rate and regular rhythm.     Heart sounds: Normal heart sounds.  Pulmonary:     Effort: Pulmonary effort is normal. No tachypnea, accessory muscle usage or respiratory distress.     Breath sounds: Normal breath sounds. No wheezing, rhonchi or rales.     Comments: Moving air well in all lung fields. No increased work of breathing noted.  Chest:     Chest wall: No tenderness.  Abdominal:     Tenderness: There is no abdominal tenderness. There is no guarding or rebound.  Lymphadenopathy:     Head:     Right side of head: No submandibular, tonsillar or occipital adenopathy.     Left side of head: No submandibular, tonsillar or occipital adenopathy.     Cervical: No cervical adenopathy.  Skin:    Coloration: Skin is not pale.     Findings: No abrasion, erythema, petechiae or rash. Rash is not papular, urticarial or vesicular.     Comments: There are some excoriations noted on her arms with some healing eschars over them. She has no drainage or honey crusting from the lesions.   Neurological:     Mental Status: She is alert.      Diagnostic studies: selected labs sent instead         Malachi Bonds, MD [facility]

## 2019-11-11 NOTE — Patient Instructions (Addendum)
1. Chronic urticaria - Your history does not have any "red flags" such as fevers, joint pains, or permanent skin changes that would be concerning for a more serious cause of hives.  - Chronic hives are often times a self limited process and will "burn themselves out" over 6-12 months, although this is not always the case.  - Start the prednisone pack provided.  - In the meantime, start suppressive dosing of antihistamines:   - Morning: Zyrtec (cetirizine) 10-20mg  (one or two tablets) + Pepcid (famotidine) 20mg   - Evening: Zyrtec (cetirizine) 10-20mg  (one or two tablets) + Pepcid (famotidine) 20mg  + Singulair (montelukast) 10mg  - You can change this dosing at home, decreasing the dose as needed or increasing the dosing as needed.  - I do not think that we need to do a large workup at this time since you are uninsured right now.  - We will see how you do on the antihistamine regimen for now.   2. Return in about 3 months (around 02/11/2020). This can be an in-person, a virtual Webex or a telephone follow up visit.   Please inform of any Emergency Department visits, hospitalizations, or changes in symptoms. Call before going to the ED for breathing or allergy symptoms since we might be able to fit you in for a sick visit. Feel free to contact 02/13/2020 anytime with any questions, problems, or concerns.  It was a pleasure to meet you today!  Websites that have reliable patient information: 1. American Academy of Asthma, Allergy, and Immunology: www.aaaai.org 2. Food Allergy Research and Education (FARE): foodallergy.org 3. Mothers of Asthmatics: http://www.asthmacommunitynetwork.org 4. American College of Allergy, Asthma, and Immunology: www.acaai.org   COVID-19 Vaccine Information can be found at: [place] For questions related to vaccine distribution or appointments, please email [email or web address] or call [phone].      "Like" [place] on Facebook and Instagram for our latest updates!        Make sure you are registered to vote! If you have moved or changed any of your contact information, you will need to get this updated before voting!  In some cases, you MAY be able to register to vote online: [email or web address]

## 2019-11-12 ENCOUNTER — Encounter: Payer: Self-pay | Admitting: Allergy & Immunology

## 2019-11-12 DIAGNOSIS — L508 Other urticaria: Secondary | ICD-10-CM | POA: Insufficient documentation

## 2019-11-18 LAB — ALPHA-GAL PANEL
Alpha Gal IgE*: <0.1 kU/L (ref <0.10)
Beef (Bos spp) IgE: <0.1 kU/L (ref <0.35)
Class Interpretation: 0
Class Interpretation: 0
Class Interpretation: 0
Lamb/Mutton (Ovis spp) IgE: <0.1 kU/L (ref <0.35)
Pork (Sus spp) IgE: <0.1 kU/L (ref <0.35)

## 2019-11-18 LAB — TRYPTASE: Tryptase: 9.6 ug/L (ref 2.2–13.2)

## 2019-12-11 ENCOUNTER — Ambulatory Visit: Payer: Self-pay | Admitting: Allergy

## 2020-02-10 ENCOUNTER — Ambulatory Visit: Payer: Self-pay | Admitting: Allergy & Immunology

## 2020-03-24 IMAGING — DX DG KNEE COMPLETE 4+V*L*
4 series · 4 of 4 positions shown · non-contrast
Comparison: Left knee radiograph dated 08/05/2015

CLINICAL DATA: 52-year-old female with left-sided knee swelling and
pain.

EXAM:
LEFT KNEE - COMPLETE 4+ VIEW

[knee ap]
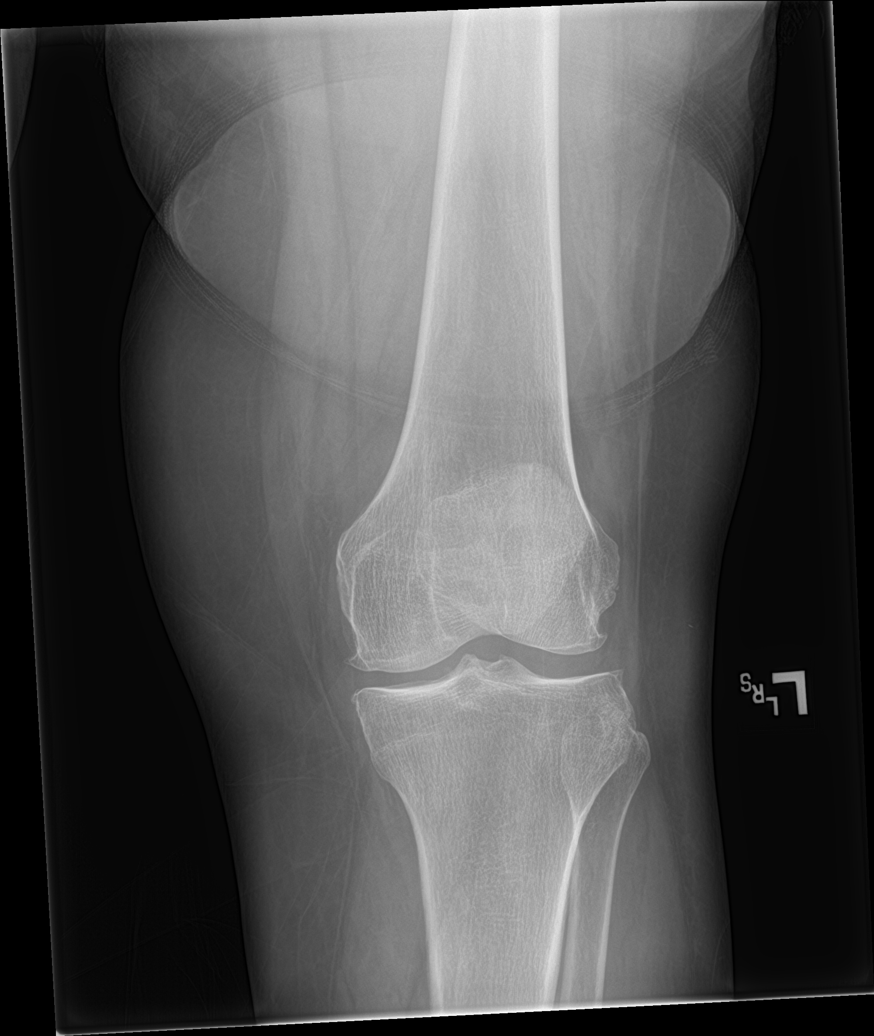

[knee lat]
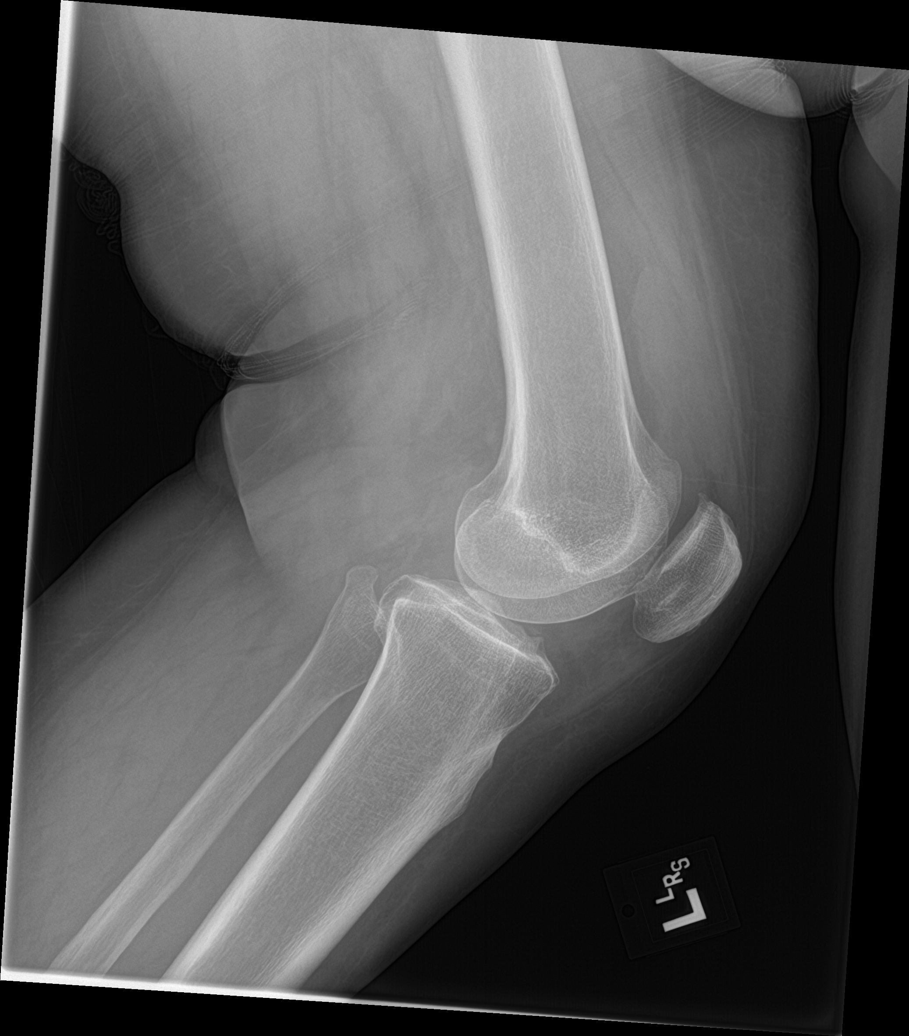

[knee obl (1 of 2)]
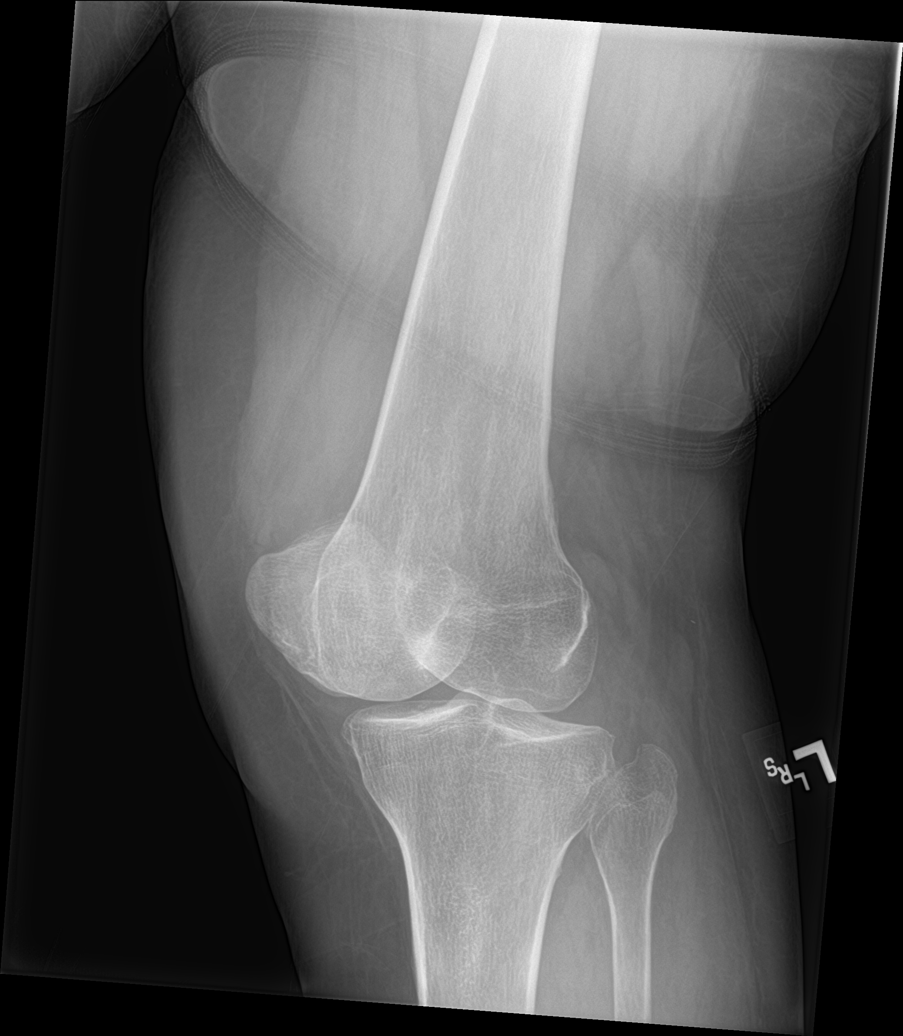

[knee obl (2 of 2)]
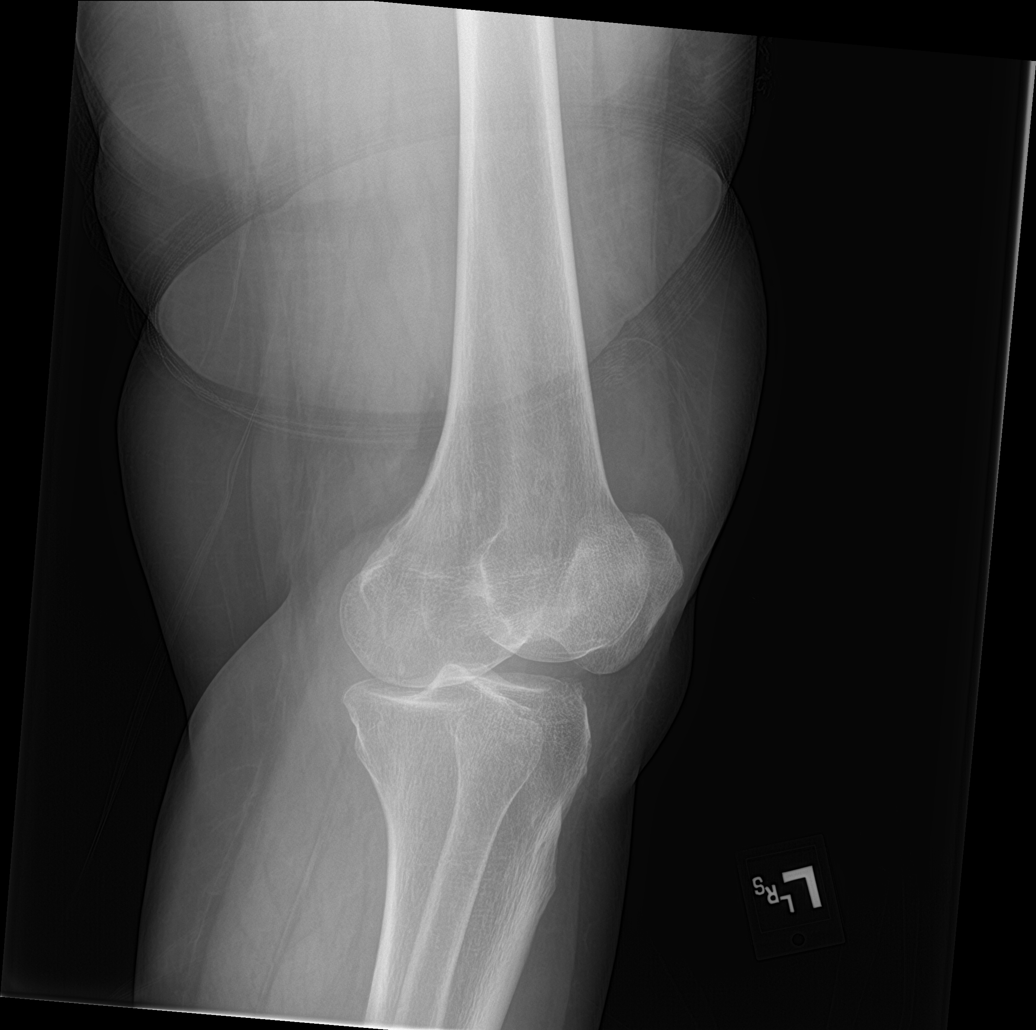

[4 of 4 positions shown; findings below may reference images not displayed]

FINDINGS: There is no acute fracture or dislocation. There is mild arthritic
changes of the knee. Small to moderate suprapatellar effusion. The
soft tissues are unremarkable.
IMPRESSION: 1. No acute fracture or dislocation.
2. Mild arthritic changes and small to moderate suprapatellar
effusion.

## 2020-05-04 ENCOUNTER — Other Ambulatory Visit: Payer: Self-pay

## 2020-05-04 DIAGNOSIS — Z20822 Contact with and (suspected) exposure to covid-19: Secondary | ICD-10-CM

## 2020-05-06 ENCOUNTER — Encounter (HOSPITAL_COMMUNITY): Payer: Self-pay | Admitting: Emergency Medicine

## 2020-05-06 ENCOUNTER — Emergency Department (HOSPITAL_COMMUNITY)
Admission: EM | Admit: 2020-05-06 | Discharge: 2020-05-07 | Disposition: A | Discharge status: Home / Self Care | Payer: HRSA Program | Attending: Emergency Medicine | Admitting: Emergency Medicine

## 2020-05-06 ENCOUNTER — Other Ambulatory Visit: Payer: Self-pay

## 2020-05-06 ENCOUNTER — Emergency Department (HOSPITAL_COMMUNITY): Payer: HRSA Program

## 2020-05-06 DIAGNOSIS — U071 COVID-19: Secondary | ICD-10-CM | POA: Diagnosis not present

## 2020-05-06 DIAGNOSIS — Z87891 Personal history of nicotine dependence: Secondary | ICD-10-CM | POA: Insufficient documentation

## 2020-05-06 DIAGNOSIS — M791 Myalgia, unspecified site: Secondary | ICD-10-CM | POA: Diagnosis present

## 2020-05-06 LAB — CBC
HCT: 37.3 % (ref 36.0–46.0)
Hemoglobin: 12.1 g/dL (ref 12.0–15.0)
MCH: 29.7 pg (ref 26.0–34.0)
MCHC: 32.4 g/dL (ref 30.0–36.0)
MCV: 91.6 fL (ref 80.0–100.0)
Platelets: 350 10*3/uL (ref 150–400)
RBC: 4.07 MIL/uL (ref 3.87–5.11)
RDW: 15.1 % (ref 11.5–15.5)
WBC: 5.6 10*3/uL (ref 4.0–10.5)
nRBC: 0.4 % — ABNORMAL HIGH (ref 0.0–0.2)

## 2020-05-06 LAB — BASIC METABOLIC PANEL
Anion gap: 10 (ref 5–15)
BUN: 7 mg/dL (ref 6–20)
CO2: 28 mmol/L (ref 22–32)
Calcium: 8.6 mg/dL — ABNORMAL LOW (ref 8.9–10.3)
Chloride: 101 mmol/L (ref 98–111)
Creatinine, Ser: 0.84 mg/dL (ref 0.44–1.00)
GFR, Estimated: >60 mL/min (ref >60)
Glucose, Bld: 87 mg/dL (ref 70–99)
Potassium: 4.7 mmol/L (ref 3.5–5.1)
Sodium: 139 mmol/L (ref 135–145)

## 2020-05-06 LAB — NOVEL CORONAVIRUS, NAA: SARS-CoV-2, NAA: DETECTED — AB

## 2020-05-06 LAB — SARS-COV-2, NAA 2 DAY TAT

## 2020-05-06 NOTE — ED Triage Notes (Signed)
Patient complains of generalized body aches and shortness of breath since Saturday. Patient tested positive for COVID two days ago. SpO2 96% on room.

## 2020-05-07 ENCOUNTER — Other Ambulatory Visit: Payer: Self-pay | Admitting: Allergy & Immunology

## 2020-05-07 ENCOUNTER — Telehealth: Payer: Self-pay

## 2020-05-07 ENCOUNTER — Telehealth: Payer: Self-pay | Admitting: Family

## 2020-05-07 MED ORDER — PREDNISONE 20 MG PO TABS: 40.0000 mg | ORAL_TABLET | Freq: Every day | ORAL | 0 refills | Status: DC

## 2020-05-07 MED ORDER — PREDNISONE 20 MG PO TABS
40.0000 mg | ORAL_TABLET | Freq: Once | ORAL | Status: AC
  Administered 2020-05-07: 40 mg via ORAL
  Filled 2020-05-07: qty 2

## 2020-05-07 MED ORDER — ALBUTEROL SULFATE HFA 108 (90 BASE) MCG/ACT IN AERS
2.0000 | INHALATION_SPRAY | RESPIRATORY_TRACT | Status: DC | PRN
  Administered 2020-05-07: 2 via RESPIRATORY_TRACT
  Filled 2020-05-07: qty 6.7

## 2020-05-07 MED ORDER — ACETAMINOPHEN 500 MG PO TABS
1000.0000 mg | ORAL_TABLET | Freq: Once | ORAL | Status: AC
  Administered 2020-05-07: 1000 mg via ORAL
  Filled 2020-05-07: qty 2

## 2020-05-07 MED ORDER — AEROCHAMBER PLUS FLO-VU LARGE MISC
Status: AC
  Filled 2020-05-07: qty 1

## 2020-05-07 MED ORDER — PREDNISONE 20 MG PO TABS: 40.0000 mg | ORAL_TABLET | Freq: Every day | ORAL | 0 refills | Status: AC

## 2020-05-07 NOTE — ED Notes (Signed)
Patient verbalizes understanding of discharge instructions. Opportunity for questioning and answers were provided. Armband removed by staff, pt discharged from ED ambulatory.   

## 2020-05-07 NOTE — Discharge Instructions (Addendum)
You need to quarantine for 5 days.  Since your positive test was 2 days ago, you need to quarantine for 3 more days.  If you are no longer running a fever at that time, you may go back to work and wear mask.  If you are still running fevers at day 5, continue to quarantine until you are no longer experiencing the fevers.

## 2020-05-07 NOTE — ED Provider Notes (Signed)
[facility] EMERGENCY DEPARTMENT Provider Note   CSN: [id number] Arrival date & time: 05/06/20  1802     History Chief Complaint  Patient presents with  . Generalized Body Aches    Linda Randolph is a 55 y.o. female.  Patient presents to the emergency department for evaluation of generalized body aches, weakness, cough, fever and chills.  Symptoms present for several days.  She reports that she tested positive for COVID 2 days ago.  Patient reports that she does have a history of wheezing when she gets sick, has been using an over-the-counter inhaler without improvement.  She feels like she is wheezing, mostly on the right side and is short of breath.        Past Medical History:  Diagnosis Date  . Iron deficiency anemia   . Urticaria     Patient Active Problem List   Diagnosis Date Noted  . Chronic urticaria 11/12/2019    Past Surgical History:  Procedure Laterality Date  . LYMPHADENECTOMY       OB History   No obstetric history on file.     Family History  Problem Relation Age of Onset  . Hypertension Mother   . Hypertension Father     Social History   Tobacco Use  . Smoking status: Former Smoker    Quit date: 03/13/2005    Years since quitting: 15.1  . Smokeless tobacco: Never Used  Vaping Use  . Vaping Use: Never used  Substance Use Topics  . Alcohol use: No  . Drug use: No    Home Medications Prior to Admission medications   Medication Sig Start Date End Date Taking? Authorizing Provider  predniSONE (DELTASONE) 20 MG tablet Take 2 tablets (40 mg total) by mouth daily with breakfast. 05/07/20  Yes Aylene Acoff, Canary Brim, MD  cetirizine (ZYRTEC ALLERGY) 10 MG tablet 1 or 2 tablets daily 11/11/19   Alfonse Spruce, MD  doxycycline (VIBRAMYCIN) 100 MG capsule Take 1 capsule (100 mg total) by mouth 2 (two) times daily. Patient not taking: Reported on 11/11/2019 10/08/19   Henderly, Britni A, PA-C  famotidine (PEPCID) 20  MG tablet Take 1 tablet (20 mg total) by mouth daily. 11/11/19   Alfonse Spruce, MD  fluticasone [facility]) 50 MCG/ACT nasal spray Place 2 sprays into both nostrils daily. 07/02/17   Fawze, Mina A, PA-C  hydrocortisone 1 % lotion Apply 1 application topically 2 (two) times daily. 09/14/19   Elson Areas, PA-C  hydrOXYzine (ATARAX/VISTARIL) 25 MG tablet Take 1 tablet (25 mg total) by mouth every 6 (six) hours. 10/08/19   Henderly, Britni A, PA-C  levonorgestrel (MIRENA) 20 MCG/24HR IUD 1 each by Intrauterine route once. 09/30/12   [provider]  mineral oil-hydrophilic petrolatum (AQUAPHOR) ointment Apply topically as needed for dry skin. 10/08/19   Henderly, Britni A, PA-C  montelukast (SINGULAIR) 10 MG tablet Take 1 tablet (10 mg total) by mouth at bedtime. 11/11/19   Alfonse Spruce, MD  oxyCODONE (ROXICODONE) 5 MG immediate release tablet Take 1 tablet (5 mg total) by mouth every 6 (six) hours as needed for severe pain. Patient not taking: Reported on 07/08/2018 03/15/17   Michela Pitcher A, PA-C    Allergies    Patient has no known allergies.  Review of Systems   Review of Systems  Constitutional: Positive for fever.  Respiratory: Positive for cough, shortness of breath and wheezing.   All other systems reviewed and are negative.   Physical Exam  Updated Vital Signs BP 106/62 (BP Location: Right Arm)   Pulse (!) 110   Temp (!) 100.4 F (38 C) (Oral)   Resp 16   Ht 5\' 9"  (1.753 m)   Wt 102.1 kg   LMP 04/08/2015   SpO2 94%   BMI 33.23 kg/m   Physical Exam Vitals and nursing note reviewed.  Constitutional:      General: She is not in acute distress.    Appearance: Normal appearance. She is well-developed and well-nourished.  HENT:     Head: Normocephalic and atraumatic.     Right Ear: Hearing normal.     Left Ear: Hearing normal.     Nose: Nose normal.     Mouth/Throat:     Mouth: Oropharynx is clear and moist and mucous membranes are normal.  Eyes:      Extraocular Movements: EOM normal.     Conjunctiva/sclera: Conjunctivae normal.     Pupils: Pupils are equal, round, and reactive to light.  Cardiovascular:     Rate and Rhythm: Regular rhythm. Tachycardia present.     Heart sounds: S1 normal and S2 normal. No murmur heard. No friction rub. No gallop.   Pulmonary:     Effort: Pulmonary effort is normal. No respiratory distress.     Breath sounds: Normal breath sounds.  Chest:     Chest wall: No tenderness.  Abdominal:     General: Bowel sounds are normal.     Palpations: Abdomen is soft. There is no hepatosplenomegaly.     Tenderness: There is no abdominal tenderness. There is no guarding or rebound. Negative signs include Murphy's sign and McBurney's sign.     Hernia: No hernia is present.  Musculoskeletal:        General: Normal range of motion.     Cervical back: Normal range of motion and neck supple.  Skin:    General: Skin is warm, dry and intact.     Findings: No rash.     Nails: There is no cyanosis.  Neurological:     Mental Status: She is alert and oriented to person, place, and time.     GCS: GCS eye subscore is 4. GCS verbal subscore is 5. GCS motor subscore is 6.     Cranial Nerves: No cranial nerve deficit.     Sensory: No sensory deficit.     Coordination: Coordination normal.     Deep Tendon Reflexes: Strength normal.  Psychiatric:        Mood and Affect: Mood and affect normal.        Speech: Speech normal.        Behavior: Behavior normal.        Thought Content: Thought content normal.     ED Results / Procedures / Treatments   Labs (all labs ordered are listed, but only abnormal results are displayed) Labs Reviewed  BASIC METABOLIC PANEL - Abnormal; Notable for the following components:      Result Value   Calcium 8.6 (*)    All other components within normal limits  CBC - Abnormal; Notable for the following components:   nRBC 0.4 (*)    All other components within normal limits     EKG None  Radiology DG Chest Portable 1 View  Result Date: 05/06/2020 CLINICAL DATA:  55 year old female female with shortness of breath. EXAM: PORTABLE CHEST 1 VIEW COMPARISON:  Chest radiograph dated 11/08/2011. FINDINGS: Shallow inspiration with bibasilar atelectasis. Diffuse chronic interstitial coarsening. No focal consolidation, pleural  effusion or pneumothorax. The cardiac silhouette is within limits. No acute osseous pathology. IMPRESSION: No active disease. Electronically Signed   By: Elgie Collard M.D.   On: 05/06/2020 19:03    Procedures Procedures (including critical care time)  Medications Ordered in ED Medications  albuterol (VENTOLIN HFA) 108 (90 Base) MCG/ACT inhaler 2 puff (has no administration in time range)  predniSONE (DELTASONE) tablet 40 mg (has no administration in time range)  acetaminophen (TYLENOL) tablet 1,000 mg (has no administration in time range)    ED Course  I have reviewed the triage vital signs and the nursing notes.  Pertinent labs & imaging results that were available during my care of the patient were reviewed by me and considered in my medical decision making (see chart for details).    MDM Rules/Calculators/A&P                          Patient does exhibit nonproductive cough during exam.  Lungs are clear to auscultation.  No active wheezing.  Vital signs reveal low-grade fever and mild tachycardia but normal oxygen saturations.  X-ray was normal.  Symptoms are explained by her recent COVID diagnosis.  With her history of questionable reactive airway disease, will prescribe prednisone and given inhaler.  Given quarantine instructions and work note. Final Clinical Impression(s) / ED Diagnoses Final diagnoses:  COVID-19    Rx / DC Orders ED Discharge Orders         Ordered    predniSONE (DELTASONE) 20 MG tablet  Daily with breakfast        05/07/20 0035           Gilda Crease, MD 05/07/20 [phone]

## 2020-05-07 NOTE — Telephone Encounter (Signed)
Linda Randolph 1965/09/16 had a courtesy refill sent in for Famotidine and Montelukast.  The pharmacist was informed this was a courtesy refill and the patient will need to make an appointment for further refills.  Patient was called and a message was left on her voicemail to call the office

## 2020-05-07 NOTE — Telephone Encounter (Signed)
Called to discuss with patient about COVID-19 symptoms and the use of one of the available treatments for those with mild to moderate Covid symptoms and at a high risk of hospitalization.  Pt appears to qualify for outpatient treatment due to co-morbid conditions and/or a member of an at-risk group in accordance with the FDA Emergency Use Authorization.    Symptom onset: Unknown  Vaccinated: Unknown Booster? Unknown Immunocompromised? No Qualifiers: Ethnicity  Unable to reach pt - Left VM   Alver Sorrow

## 2020-05-07 NOTE — ED Notes (Signed)
Ambulated pt to the room on pulse ox, O2 sat dropped to 95%

## 2020-05-10 ENCOUNTER — Telehealth: Payer: Self-pay | Admitting: General Practice

## 2020-05-10 NOTE — Telephone Encounter (Signed)
Patient called making [place] aware that she did get a call and speak with someone about her COVID results. No further instruction is needed.

## 2020-05-30 ENCOUNTER — Other Ambulatory Visit: Payer: Self-pay | Admitting: Allergy & Immunology
